# Patient Record
Sex: Male | Born: 1970
Health system: Southern US, Community
[De-identification: ages and names within clinical notes are randomized; demographics above are authoritative.]

## PROBLEM LIST (undated history)

## (undated) DIAGNOSIS — I10 Essential (primary) hypertension: Secondary | ICD-10-CM

---

## 2010-07-16 ENCOUNTER — Other Ambulatory Visit: Payer: Self-pay | Admitting: Family Medicine

## 2010-07-16 ENCOUNTER — Ambulatory Visit
Admission: RE | Admit: 2010-07-16 | Discharge: 2010-07-16 | Disposition: A | Discharge status: Home / Self Care | Payer: BC Managed Care – PPO | Source: Ambulatory Visit | Attending: Family Medicine | Admitting: Family Medicine

## 2010-07-16 ENCOUNTER — Other Ambulatory Visit: Payer: Self-pay

## 2010-07-16 DIAGNOSIS — K3189 Other diseases of stomach and duodenum: Secondary | ICD-10-CM

## 2010-07-16 DIAGNOSIS — R1013 Epigastric pain: Secondary | ICD-10-CM

## 2012-02-17 ENCOUNTER — Emergency Department (HOSPITAL_COMMUNITY)
Admission: EM | Admit: 2012-02-17 | Discharge: 2012-02-17 | Disposition: A | Discharge status: Home / Self Care | Payer: Self-pay | Attending: Emergency Medicine | Admitting: Emergency Medicine

## 2012-02-17 ENCOUNTER — Encounter (HOSPITAL_COMMUNITY): Payer: Self-pay | Admitting: *Deleted

## 2012-02-17 DIAGNOSIS — R05 Cough: Secondary | ICD-10-CM | POA: Insufficient documentation

## 2012-02-17 DIAGNOSIS — I10 Essential (primary) hypertension: Secondary | ICD-10-CM | POA: Insufficient documentation

## 2012-02-17 DIAGNOSIS — R630 Anorexia: Secondary | ICD-10-CM | POA: Insufficient documentation

## 2012-02-17 DIAGNOSIS — J020 Streptococcal pharyngitis: Secondary | ICD-10-CM | POA: Insufficient documentation

## 2012-02-17 DIAGNOSIS — R059 Cough, unspecified: Secondary | ICD-10-CM | POA: Insufficient documentation

## 2012-02-17 DIAGNOSIS — N644 Mastodynia: Secondary | ICD-10-CM | POA: Insufficient documentation

## 2012-02-17 DIAGNOSIS — R509 Fever, unspecified: Secondary | ICD-10-CM | POA: Insufficient documentation

## 2012-02-17 MED ORDER — DEXAMETHASONE SODIUM PHOSPHATE 10 MG/ML IJ SOLN
10.0000 mg | Freq: Once | INTRAMUSCULAR | Status: AC
  Administered 2012-02-17: 10 mg via INTRAMUSCULAR
  Filled 2012-02-17: qty 1

## 2012-02-17 MED ORDER — PENICILLIN G BENZATHINE & PROC 1200000 UNIT/2ML IM SUSP
1.2000 10*6.[IU] | Freq: Once | INTRAMUSCULAR | Status: AC
  Administered 2012-02-17: 1.2 10*6.[IU] via INTRAMUSCULAR
  Filled 2012-02-17: qty 2

## 2012-02-17 MED ORDER — HYDROCODONE-HOMATROPINE 5-1.5 MG/5ML PO SYRP: 5.0000 mL | ORAL_SOLUTION | Freq: Four times a day (QID) | ORAL | Status: DC | PRN

## 2012-02-17 MED ORDER — HYDROCOD POLST-CHLORPHEN POLST 10-8 MG/5ML PO LQCR
5.0000 mL | Freq: Once | ORAL | Status: AC
  Administered 2012-02-17: 5 mL via ORAL
  Filled 2012-02-17: qty 5

## 2012-02-17 NOTE — ED Notes (Addendum)
Hard approx 4 x 4 masses behind each nipple.  No drainage, warmth noted.  No redness noted to pharynx.  Pt stated throat pain began after coughing.  Lung sounds clear.

## 2012-02-17 NOTE — ED Provider Notes (Signed)
History     CSN: [id number]  Arrival date & time 02/17/12  4098   First MD Initiated Contact with Patient 02/17/12 [phone]      Chief Complaint  Patient presents with  . Chest Pain    R/t hard swelling behind both nipples  . Sore Throat    (Consider location/radiation/quality/duration/timing/severity/associated sxs/prior treatment) HPI Comments: 41 year old male with no known significant past medical history presents emergency department with multiple complaints.  Primarily patient reports that he's had a sore throat for one week that has been gradually worsening.  Associated symptoms include fever and cough onset today.  In addition he reports feeling behind full bilaterally that have been gradually growing and painful.  Patient denies any nipple discharge  change in skin color night sweats, chills, weight loss.patient did not have a primary care physician and has not been evaluated in years.    The history is provided by the patient.    History reviewed. No pertinent past medical history.  History reviewed. No pertinent past surgical history.  No family history on file.  History  Substance Use Topics  . Smoking status: Never Smoker   . Smokeless tobacco: Not on file  . Alcohol Use: No      Review of Systems  Constitutional: Positive for fever, chills and appetite change. Negative for diaphoresis, activity change and fatigue.  HENT: Positive for sore throat and trouble swallowing. Negative for congestion, facial swelling, rhinorrhea, sneezing, drooling, mouth sores, neck pain, neck stiffness, dental problem, voice change, postnasal drip and sinus pressure.   Eyes: Negative for visual disturbance.  Respiratory: Positive for cough. Negative for choking, shortness of breath, wheezing and stridor.   Cardiovascular: Negative for chest pain.  Gastrointestinal: Negative for abdominal pain.  Skin: Negative for rash.  Neurological: Negative for dizziness and headaches.   Hematological: Does not bruise/bleed easily.  All other systems reviewed and are negative.    Allergies  Review of patient's allergies indicates no known allergies.  Home Medications  No current outpatient prescriptions on file.  BP 153/105  Pulse 84  Temp 98.7 F (37.1 C) (Oral)  Resp 18  SpO2 99%  Physical Exam  Nursing note and vitals reviewed. Constitutional: He is oriented to person, place, and time. He appears well-developed and well-nourished. No distress.  HENT:  Head: Normocephalic and atraumatic. No trismus in the jaw.  Right Ear: Tympanic membrane, external ear and ear canal normal.  Left Ear: Tympanic membrane, external ear and ear canal normal.  Nose: Nose normal. No rhinorrhea. Right sinus exhibits no maxillary sinus tenderness and no frontal sinus tenderness. Left sinus exhibits no maxillary sinus tenderness and no frontal sinus tenderness.  Mouth/Throat: Uvula is midline and mucous membranes are normal. Normal dentition. No dental abscesses or uvula swelling. Oropharyngeal exudate and posterior oropharyngeal edema present. No posterior oropharyngeal erythema or tonsillar abscesses.       No submental edema, tongue not elevated, no trismus. No impending airway obstruction; Pt able to speak full sentences, swallow intact, no drooling, stridor, or tonsillar/uvula displacement. No palatal petechia  Eyes: Conjunctivae normal are normal.  Neck: Trachea normal, normal range of motion and full passive range of motion without pain. Neck supple. No rigidity. Normal range of motion present. No Brudzinski's sign noted.       Flexion and extension of neck without pain or difficulty. Able to breath without difficulty in extension.  Cardiovascular: Normal rate and regular rhythm.   Pulmonary/Chest: Effort normal and breath sounds normal. No stridor. No  respiratory distress. He has no wheezes. He exhibits tenderness (tenderness to palpation of breast bilaterally, no masses palpable  ).  Abdominal: Soft. There is no tenderness.       No obvious evidence of splenomegaly. Non ttp.   Musculoskeletal: Normal range of motion.  Lymphadenopathy:       Head (right side): No preauricular and no posterior auricular adenopathy present.       Head (left side): No preauricular and no posterior auricular adenopathy present.    He has cervical adenopathy.  Neurological: He is alert and oriented to person, place, and time.  Skin: Skin is warm and dry. No rash noted. He is not diaphoretic.  Psychiatric: He has a normal mood and affect.    ED Course  Procedures (including critical care time)  Labs Reviewed - No data to display No results found.   No diagnosis found.    MDM  Hypertension, sore throat, chest/breast tenderness  Pt hypertensive with tonsillar exudate, cervical lymphadenopathy, & dysphagia; diagnosis of strep. Treated in the Ed with steroids, Pain medication and PCN IM.  Pt appears mildly dehydrated, discussed importance of water rehydration. Presentation non concerning for PTA or infxn spread to soft tissue. No trismus or uvula deviation. Specific return precautions discussed. Pt able to drink water in ED without difficulty with intact air way. IN addition will refer to breast center for imaging. PCP guide given to evaluate HTN Recommended PCP follow up.          Jaci Carrel, [place] 02/17/12 [phone]

## 2012-02-17 NOTE — ED Provider Notes (Signed)
Medical screening examination/treatment/procedure(s) were performed by non-physician practitioner and as supervising physician I was immediately available for consultation/collaboration.  Jones Skene, M.D.     Jones Skene, MD 02/17/12 1739

## 2012-02-17 NOTE — ED Notes (Signed)
Pt states he felt small lumps behind both nipples. Since then they have grown to at least 4 inches in diameter and are increasingly painful.  Denies discharge.  Pt also c/o sore throat and cough x 1 week.  Came in tonight because the swelling behind his nipples is becoming very painful.

## 2012-02-17 NOTE — Discharge Instructions (Signed)
Salt Water Gargle This solution will help make your mouth and throat feel better. HOME CARE INSTRUCTIONS   Mix 1 teaspoon of salt in 8 ounces of warm water.  Gargle with this solution as much or often as you need or as directed. Swish and gargle gently if you have any sores or wounds in your mouth.  Do not swallow this mixture. Document Released: 12/03/2003 Document Revised: 05/23/2011 Document Reviewed: 04/25/2008 [facility] Patient Information 2013 [place], [place].  SEEK MEDICAL CARE IF:   Any part of your breast is hard, red and hot to the touch. This could be a sign of infection.  Fluid is coming out of your nipples (and you are not breastfeeding). Especially watch for blood or pus.  You have a fever as well as breast tenderness.  You have a new or painful lump in your breast that remains after your period ends.  You have tried to take care of the pain at home, but it has not gone away.  Your breast pain is getting worse. Or, the pain is making it hard to do the things you usually do during your day. Document Released: 02/11/2008 Document Revised: 05/23/2011 Document Reviewed: 02/11/2008 [facility] Patient Information 2013 [place], [place].

## 2012-11-10 ENCOUNTER — Emergency Department (HOSPITAL_COMMUNITY): Payer: BC Managed Care – PPO

## 2012-11-10 ENCOUNTER — Encounter (HOSPITAL_COMMUNITY): Payer: Self-pay | Admitting: Nurse Practitioner

## 2012-11-10 ENCOUNTER — Emergency Department (HOSPITAL_COMMUNITY)
Admission: EM | Admit: 2012-11-10 | Discharge: 2012-11-10 | Disposition: A | Discharge status: Home / Self Care | Payer: BC Managed Care – PPO | Attending: Emergency Medicine | Admitting: Emergency Medicine

## 2012-11-10 DIAGNOSIS — R209 Unspecified disturbances of skin sensation: Secondary | ICD-10-CM | POA: Insufficient documentation

## 2012-11-10 DIAGNOSIS — M545 Low back pain, unspecified: Secondary | ICD-10-CM | POA: Insufficient documentation

## 2012-11-10 DIAGNOSIS — M549 Dorsalgia, unspecified: Secondary | ICD-10-CM

## 2012-11-10 DIAGNOSIS — R202 Paresthesia of skin: Secondary | ICD-10-CM

## 2012-11-10 LAB — URINALYSIS, ROUTINE W REFLEX MICROSCOPIC
Bilirubin Urine: NEGATIVE
Glucose, UA: NEGATIVE mg/dL
Ketones, ur: NEGATIVE mg/dL
Leukocytes, UA: NEGATIVE
Specific Gravity, Urine: 1.014 (ref 1.005–1.030)
pH: 5.5 (ref 5.0–8.0)

## 2012-11-10 LAB — GLUCOSE, CAPILLARY: Glucose-Capillary: 98 mg/dL (ref 70–99)

## 2012-11-10 MED ORDER — IBUPROFEN 800 MG PO TABS: 800.0000 mg | ORAL_TABLET | Freq: Three times a day (TID) | ORAL | Status: DC

## 2012-11-10 MED ORDER — HYDROCODONE-ACETAMINOPHEN 5-325 MG PO TABS: 2.0000 | ORAL_TABLET | ORAL | Status: DC | PRN

## 2012-11-10 NOTE — ED Notes (Addendum)
Pt reports intermittent loss of sensation in bilateral legs from thighs down to feet over past weeks, then he started to have L sided lower back pain. Over past few days has felt "Clammy and sweaty." ambulatory now, A&Ox4. No bowel/bladder changes

## 2012-11-10 NOTE — ED Notes (Signed)
Pt ambulated well.  Did not complain of pain or numbness at this time.

## 2012-11-10 NOTE — ED Notes (Addendum)
Pt reports bilateral intermittent numbness to lower extremities that lasts for 5 seconds at a time x2 weeks. Pt also states he had left sided posterior pain above flank area and goes down leg x1 week. Pt states pain in his back is unrelated to leg numbness.

## 2012-11-10 NOTE — ED Provider Notes (Signed)
CSN: [id number]     Arrival date & time 11/10/12  1018 History   First MD Initiated Contact with Patient 11/10/12 1114     Chief Complaint  Patient presents with  . Back Pain   (Consider location/radiation/quality/duration/timing/severity/associated sxs/prior Treatment) HPI Comments: Patient claims intermittent "loss of sensation" in his bilateral legs from his thighs to the feet for the past few weeks. He states this numbness lasted for 5 seconds at a time and resolves but recurs frequently. He denies any focal weakness. No bowel or bladder incontinence. No difficulty walking. No abdominal pain, nausea vomiting or fever. She denies any history of back problems. He also endorses left sided flank pain it radiates down his left leg for the past one week as well. Denies any back injury. Denies any urinary symptoms. Denies any testicular pain. Denies any history of cancer or IV drug abuse. He has no numbness currently.  The history is provided by the patient.    History reviewed. No pertinent past medical history. History reviewed. No pertinent past surgical history. History reviewed. No pertinent family history. History  Substance Use Topics  . Smoking status: Never Smoker   . Smokeless tobacco: Not on file  . Alcohol Use: No    Review of Systems  Constitutional: Negative for activity change and appetite change.  HENT: Negative for congestion and rhinorrhea.   Respiratory: Negative for cough, chest tightness and shortness of breath.   Cardiovascular: Negative for chest pain.  Gastrointestinal: Negative for nausea, vomiting and abdominal pain.  Genitourinary: Negative for dysuria, hematuria and testicular pain.  Musculoskeletal: Positive for back pain. Negative for gait problem.  Skin: Negative for rash.  Neurological: Positive for numbness. Negative for dizziness, weakness and headaches.  A complete 10 system review of systems was obtained and all systems are negative except as noted in  the HPI and PMH.    Allergies  Review of patient's allergies indicates no known allergies.  Home Medications   Current Outpatient Rx  Name  Route  Sig  Dispense  Refill  . HYDROcodone-acetaminophen (NORCO/VICODIN) 5-325 MG per tablet   Oral   Take 2 tablets by mouth every 4 (four) hours as needed for pain.   10 tablet   0   . ibuprofen (ADVIL,MOTRIN) 800 MG tablet   Oral   Take 1 tablet (800 mg total) by mouth 3 (three) times daily.   21 tablet   0    BP 151/99  Pulse 60  Temp(Src) 97.9 F (36.6 C) (Oral)  Resp 18  SpO2 94% Physical Exam  Constitutional: He is oriented to person, place, and time. He appears well-developed and well-nourished. No distress.  HENT:  Head: Normocephalic.  Mouth/Throat: Oropharynx is clear and moist. No oropharyngeal exudate.  Eyes: Conjunctivae and EOM are normal. Pupils are equal, round, and reactive to light.  Neck: Normal range of motion. Neck supple.  Cardiovascular: Normal rate, regular rhythm and normal heart sounds.   No murmur heard. Equal femoral, DP, radial pulses bilaterally.  Pulmonary/Chest: Effort normal and breath sounds normal. No respiratory distress.  Abdominal: There is no tenderness. There is no rebound and no guarding.  Musculoskeletal: Normal range of motion. He exhibits tenderness. He exhibits no edema.  No midline T or L spine tenderness. L CVAT  5/5 strength in bilateral lower extremities. Ankle plantar and dorsiflexion intact. Great toe extension intact bilaterally. +2 DP and PT pulses. +2 patellar reflexes bilaterally. Normal gait.   Neurological: He is alert and oriented to person,  place, and time. No cranial nerve deficit. He exhibits normal muscle tone. Coordination normal.  Skin: Skin is warm.    ED Course  Procedures (including critical care time) Labs Review Labs Reviewed  URINALYSIS, ROUTINE W REFLEX MICROSCOPIC  GLUCOSE, CAPILLARY   Imaging Review Dg Lumbar Spine Complete  11/10/2012    *RADIOLOGY REPORT*  Clinical Data: Low back pain, no injury  LUMBAR SPINE - COMPLETE 4+ VIEW  Comparison: None  Findings: Normal alignment.  No fracture or mass.  Mild disc degeneration with small endplate defects anteriorly at the L2-3 and L3-4.  Negative for pars defect.  No mass lesion.  IMPRESSION: Mild disc degeneration.  No acute abnormality.   Original Report Authenticated By: Janeece Riggers, M.D.    MDM   1. Paresthesias   2. Back pain    Intermittent tingling sensation to bilateral legs associated with left-sided low back pain. Normal neurological exam at this time. No evidence of cauda equina or cord compression.  Patient has no motor or sensory neurological deficits on exam. His urinalysis is normal. His CBG is normal.  He is currently having no paresthesias or weakness. Uncertain cause of patient's intermittent paresthesias in his lower extremities. No evidence for cord compression, cauda equina or other neurosurgical emergency. He is able to ambulate without pain, weakness or numbness. He'll be referred to neurology. Return precautions discussed.  Glynn Octave, MD 11/10/12 [phone]

## 2013-08-03 ENCOUNTER — Encounter (HOSPITAL_COMMUNITY): Payer: Self-pay | Admitting: Emergency Medicine

## 2013-08-03 ENCOUNTER — Emergency Department (HOSPITAL_COMMUNITY): Payer: BC Managed Care – PPO

## 2013-08-03 ENCOUNTER — Emergency Department (HOSPITAL_COMMUNITY)
Admission: EM | Admit: 2013-08-03 | Discharge: 2013-08-03 | Disposition: A | Discharge status: Home / Self Care | Payer: BC Managed Care – PPO | Attending: Emergency Medicine | Admitting: Emergency Medicine

## 2013-08-03 DIAGNOSIS — N451 Epididymitis: Secondary | ICD-10-CM

## 2013-08-03 DIAGNOSIS — N453 Epididymo-orchitis: Secondary | ICD-10-CM | POA: Insufficient documentation

## 2013-08-03 LAB — URINALYSIS, ROUTINE W REFLEX MICROSCOPIC
BILIRUBIN URINE: NEGATIVE
GLUCOSE, UA: NEGATIVE mg/dL
KETONES UR: NEGATIVE mg/dL
Nitrite: NEGATIVE
PROTEIN: 30 mg/dL — AB
Specific Gravity, Urine: 1.018 (ref 1.005–1.030)
Urobilinogen, UA: 1 mg/dL (ref 0.0–1.0)
pH: 5.5 (ref 5.0–8.0)

## 2013-08-03 LAB — URINE MICROSCOPIC-ADD ON

## 2013-08-03 MED ORDER — CIPROFLOXACIN HCL 500 MG PO TABS: 500.0000 mg | ORAL_TABLET | Freq: Two times a day (BID) | ORAL | Status: AC

## 2013-08-03 MED ORDER — HYDROCODONE-ACETAMINOPHEN 5-325 MG PO TABS: 1.0000 | ORAL_TABLET | ORAL | Status: AC | PRN

## 2013-08-03 MED ORDER — MORPHINE SULFATE 4 MG/ML IJ SOLN
4.0000 mg | Freq: Once | INTRAMUSCULAR | Status: AC
  Administered 2013-08-03: 4 mg via INTRAVENOUS
  Filled 2013-08-03: qty 1

## 2013-08-03 NOTE — ED Notes (Signed)
Pt states right testicular pain, onset Monday.  Painful to touch, relieved by Motrin. No urinary symptoms noted.

## 2013-08-03 NOTE — Discharge Instructions (Signed)
Take the prescribed medication as directed. Follow-up with urology if problems occur. Return to the ED for new or worsening symptoms.

## 2013-08-03 NOTE — ED Provider Notes (Signed)
CSN: [id number]     Arrival date & time 08/03/13  1315 History   First MD Initiated Contact with Patient 08/03/13 1348     Chief Complaint  Patient presents with  . Testicle Pain     (Consider location/radiation/quality/duration/timing/severity/associated sxs/prior Treatment) HPI Comments: Patient is a 43 yo M presenting to the ED for severe sore R testicular pain and swelling that began Monday while at work, but denies any trauma or injury. He states Motrin had been alleviating his pain until the last day or so. His pain is worsened with palpation, and palpation causes radiation to right groin. Denies any fevers, chills, nausea, vomiting, abdominal pain, urinary symptoms, rectal pain, penile discharge or pain, constipation, or diarrhea. No abdominal surgical history.   Patient is a 43 y.o. male presenting with testicular pain.  Testicle Pain Pertinent negatives include no abdominal pain, chills, fever, nausea or vomiting.    History reviewed. No pertinent past medical history. History reviewed. No pertinent past surgical history. No family history on file. History  Substance Use Topics  . Smoking status: Never Smoker   . Smokeless tobacco: Not on file  . Alcohol Use: No    Review of Systems  Constitutional: Negative for fever and chills.  Gastrointestinal: Negative for nausea, vomiting, abdominal pain and diarrhea.  Genitourinary: Positive for scrotal swelling and testicular pain. Negative for dysuria, urgency, frequency, hematuria, flank pain, decreased urine volume, discharge, penile swelling, enuresis, difficulty urinating, genital sores and penile pain.  Musculoskeletal: Negative for back pain.  All other systems reviewed and are negative.     Allergies  Review of patient's allergies indicates no known allergies.  Home Medications   Prior to Admission medications   Medication Sig Start Date End Date Taking? Authorizing Provider  ibuprofen (ADVIL,MOTRIN) 200 MG tablet  Take 200 mg by mouth every 6 (six) hours as needed for moderate pain.   Yes Historical Provider, MD   BP 151/100  Pulse 73  Temp(Src) 97.9 F (36.6 C) (Oral)  Resp 18  SpO2 100% Physical Exam  Nursing note and vitals reviewed. Constitutional: He is oriented to person, place, and time. He appears well-developed and well-nourished. No distress.  HENT:  Head: Normocephalic and atraumatic.  Right Ear: External ear normal.  Left Ear: External ear normal.  Nose: Nose normal.  Mouth/Throat: Oropharynx is clear and moist.  Eyes: Conjunctivae are normal.  Neck: Normal range of motion. Neck supple.  Cardiovascular: Normal rate, regular rhythm and normal heart sounds.   Pulmonary/Chest: Effort normal and breath sounds normal. No respiratory distress.  Abdominal: Soft. Bowel sounds are normal. There is no tenderness.  Genitourinary: Rectum normal, prostate normal and penis normal. Right testis shows swelling and tenderness. Right testis shows no mass. Right testis is descended. Left testis shows no mass, no swelling and no tenderness. Left testis is descended. Uncircumcised. No phimosis, paraphimosis, hypospadias, penile erythema or penile tenderness. No discharge found.  Right testicle is not warmth or erythematous.   Musculoskeletal: Normal range of motion.  Lymphadenopathy:       Right: No inguinal adenopathy present.       Left: No inguinal adenopathy present.  Neurological: He is alert and oriented to person, place, and time.  Skin: Skin is warm and dry. He is not diaphoretic.  Psychiatric: He has a normal mood and affect.    ED Course  Procedures (including critical care time) Medications  morphine 4 MG/ML injection 4 mg (4 mg Intravenous Given 08/03/13 1445)    Labs  Review Labs Reviewed  URINE CULTURE  GC/CHLAMYDIA PROBE AMP  URINALYSIS, ROUTINE W REFLEX MICROSCOPIC    Imaging Review No results found.   EKG Interpretation None      MDM   Final diagnoses:  None     Filed Vitals:   08/03/13 1321  BP: 151/100  Pulse: 73  Temp: 97.9 F (36.6 C)  Resp: 18     Afebrile, NAD, non-toxic appearing, AAOx4. Left testicle is swollen and TTP, no erythema or warmth. No penile tenderness or discharge. No paraphimosis or phimosis. R testicle is non TTP. Prostate examination is benign. Plan to order ultrasound. UA sent, Urine GC/Chlamydia sent as well. Patient signed out to Sharilyn SitesLisa Sanders, PA-C pending UA and imaging.   Jeannetta EllisJennifer L Carrel Leather, PA-C 08/03/13 1617

## 2013-08-03 NOTE — ED Notes (Signed)
US Bedside.

## 2013-08-03 NOTE — ED Provider Notes (Signed)
Medical screening examination/treatment/procedure(s) were performed by non-physician practitioner and as supervising physician I was immediately available for consultation/collaboration.   EKG Interpretation None        Ellianna Ruest L Neeti Knudtson, MD 08/03/13 2211 

## 2013-08-03 NOTE — ED Provider Notes (Signed)
Pt received in sign out from PA Piepenbrink at shift change.  43 y.o. M with right testicle pain beginning Monday morning (5 days ago).  No abdominal pain, urinary sx, urethral discharge, fever, chills. Pt is married, no concern for STD.  No prior hx of STD.  Plan:  Urine and gc/chl pending.  U/s also pending to r/o torsion.  If ultrasound negative for torsion, start ciprofloxacin.  Results for orders placed during the hospital encounter of 08/03/13  URINALYSIS, ROUTINE W REFLEX MICROSCOPIC      Result Value Ref Range   Color, Urine YELLOW  YELLOW   APPearance CLOUDY (*) CLEAR   Specific Gravity, Urine 1.018  1.005 - 1.030   pH 5.5  5.0 - 8.0   Glucose, UA NEGATIVE  NEGATIVE mg/dL   Hgb urine dipstick MODERATE (*) NEGATIVE   Bilirubin Urine NEGATIVE  NEGATIVE   Ketones, ur NEGATIVE  NEGATIVE mg/dL   Protein, ur 30 (*) NEGATIVE mg/dL   Urobilinogen, UA 1.0  0.0 - 1.0 mg/dL   Nitrite NEGATIVE  NEGATIVE   Leukocytes, UA LARGE (*) NEGATIVE  URINE MICROSCOPIC-ADD ON      Result Value Ref Range   WBC, UA 21-50  <3 WBC/hpf   RBC / HPF 3-6  <3 RBC/hpf   [place] Scrotum  08/03/2013   CLINICAL DATA:  Testicle pain  EXAM: ULTRASOUND OF SCROTUM  TECHNIQUE: Complete ultrasound examination of the testicles, epididymis, and other scrotal structures was performed.  COMPARISON:  None.  FINDINGS: Right testicle  Measurements: 4.6 x 2.3 x 3.0 cm. No mass or microlithiasis visualized. The right testicular parenchyma is slightly hypervascular compared to the left.  Left testicle  Measurements: 4.6 x 2.5 x 2.9 cm. No mass or microlithiasis visualized.  Right epididymis:  Mildly heterogeneous and hypervascular.  Left epididymis:  6 mm spermatocele versus epididymal cyst.  Hydrocele: Trace right hydrocele is sonographically simple in appearance.  Varicocele: Prominent template of formed venous plexus. Echogenic material within the inguinal canal may represent spermatic cord lipoma or herniated omental fat.  IMPRESSION:  1. Sonographic findings are suggestive of right-sided epididymal orchitis. 2. Trace right hydrocele is likely reactive. 3. Echogenic material on the right inguinal canal may represent spermatic cord lipoma or some a herniated omental fat. 4. 6 mm left epididymal cyst incidentally noted.   Electronically Signed   By: Malachy MoanHeath  McCullough M.D.   On: 08/03/2013 15:38   [place] Art/ven Flow Abd Pelv Doppler  08/03/2013   CLINICAL DATA:  Testicle pain  EXAM: ULTRASOUND OF SCROTUM  TECHNIQUE: Complete ultrasound examination of the testicles, epididymis, and other scrotal structures was performed.  COMPARISON:  None.  FINDINGS: Right testicle  Measurements: 4.6 x 2.3 x 3.0 cm. No mass or microlithiasis visualized. The right testicular parenchyma is slightly hypervascular compared to the left.  Left testicle  Measurements: 4.6 x 2.5 x 2.9 cm. No mass or microlithiasis visualized.  Right epididymis:  Mildly heterogeneous and hypervascular.  Left epididymis:  6 mm spermatocele versus epididymal cyst.  Hydrocele: Trace right hydrocele is sonographically simple in appearance.  Varicocele: Prominent template of formed venous plexus. Echogenic material within the inguinal canal may represent spermatic cord lipoma or herniated omental fat.  IMPRESSION: 1. Sonographic findings are suggestive of right-sided epididymal orchitis. 2. Trace right hydrocele is likely reactive. 3. Echogenic material on the right inguinal canal may represent spermatic cord lipoma or some a herniated omental fat. 4. 6 mm left epididymal cyst incidentally noted.   Electronically Signed  By: Malachy Moan M.D.   On: 08/03/2013 15:38    4:16 PM Ultrasound without evidence of torsion, suggestive of right-sided epididymal orchitis.  U/a with large leuks, will send for culture.  Gc/chl pending.  On discussion with pt he again confirms that he has no prior hx of STD and has no current concern for STD, only partner is his wife.  Will start on ciprofloxacin.  He  will FU with urology.  vicodin for pain. Discussed plan with patient, he/she acknowledged understanding and agreed with plan of care.  Return precautions given for new or worsening symptoms.  Garlon Hatchet, PA-C 08/03/13 [phone]

## 2013-08-03 NOTE — ED Notes (Signed)
Pt presents with c/o right testicular pain that started Monday of this week and is now traveling through his right groin area. Pt thought it was a pulled muscle at first. Pt says the testicle is very painful to touch, unsure of whether there is swelling or not at this time. Pt has no urinary symptoms or problems at this time.

## 2013-08-03 NOTE — ED Notes (Signed)
Provided pt with Urinal. Explained we needed sample and to notify when voided

## 2013-08-05 LAB — URINE CULTURE
Colony Count: NO GROWTH
Culture: NO GROWTH

## 2013-08-05 LAB — GC/CHLAMYDIA PROBE AMP
CT PROBE, AMP APTIMA: NEGATIVE
GC PROBE AMP APTIMA: NEGATIVE

## 2013-08-05 NOTE — ED Provider Notes (Signed)
Medical screening examination/treatment/procedure(s) were performed by non-physician practitioner and as supervising physician I was immediately available for consultation/collaboration.   EKG Interpretation None       Flint Melter, MD 08/05/13 1019

## 2014-11-19 IMAGING — US US SCROTUM
1 series · 14 of 25 positions shown · non-contrast
Comparison: None.

CLINICAL DATA: Testicle pain

EXAM:
ULTRASOUND OF SCROTUM
TECHNIQUE: Complete ultrasound examination of the testicles, epididymis, and
other scrotal structures was performed.

[Series 1: us scrotum · 0.06mm/px · 45 acquisitions, 14 frames shown]
[im 1/45]
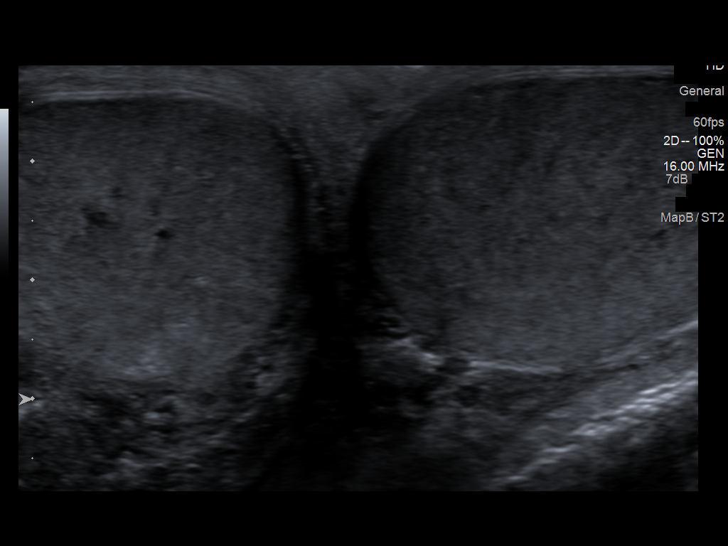
[im 4/45]
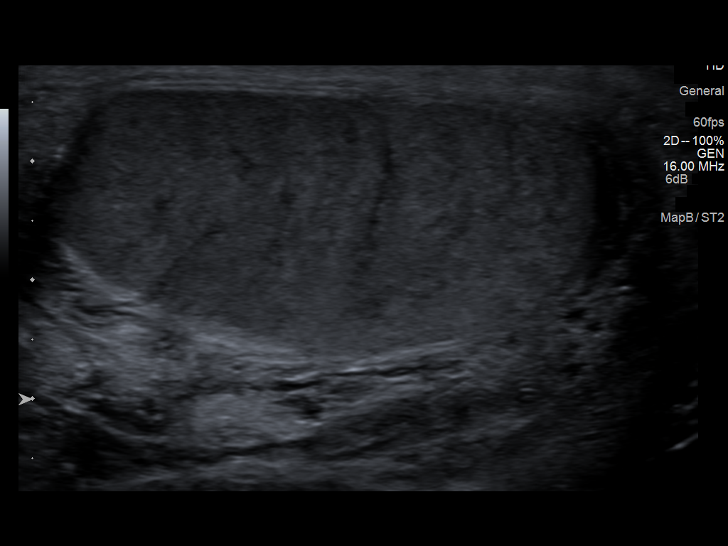
[im 8/45]
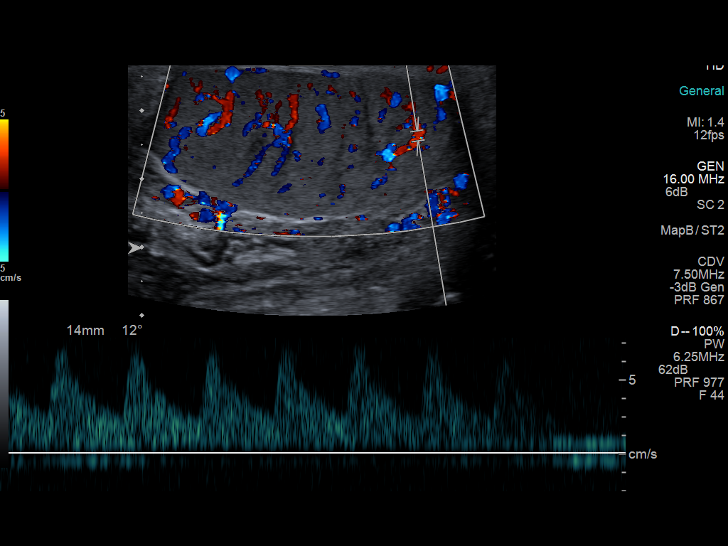
[im 12/45]
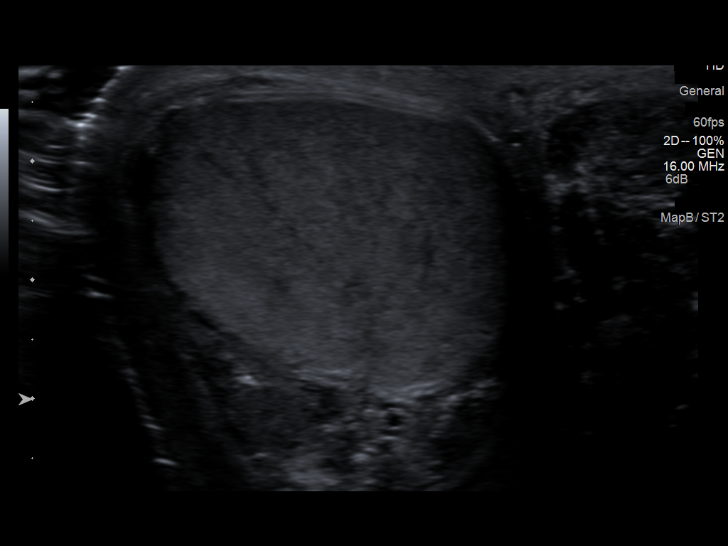
[im 15/45]
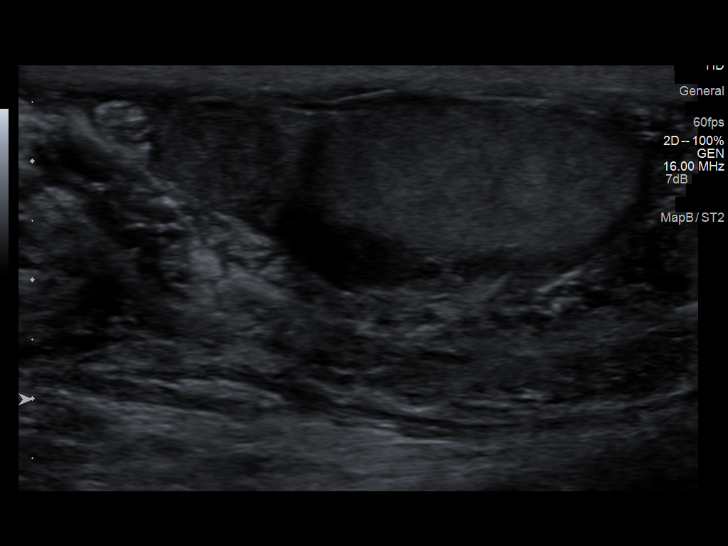
[im 17/45]
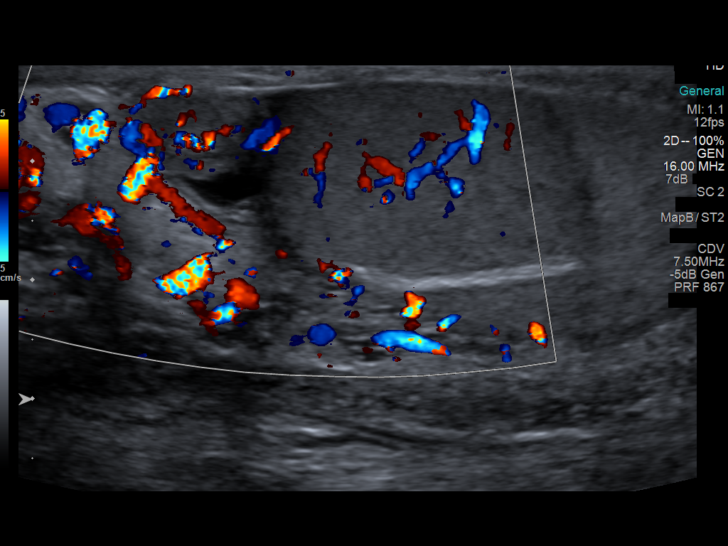
[im 21/45]
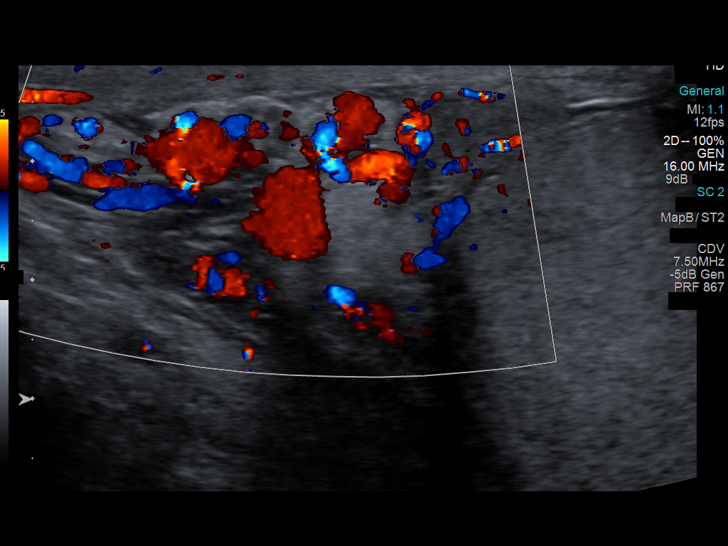
[im 24/45]
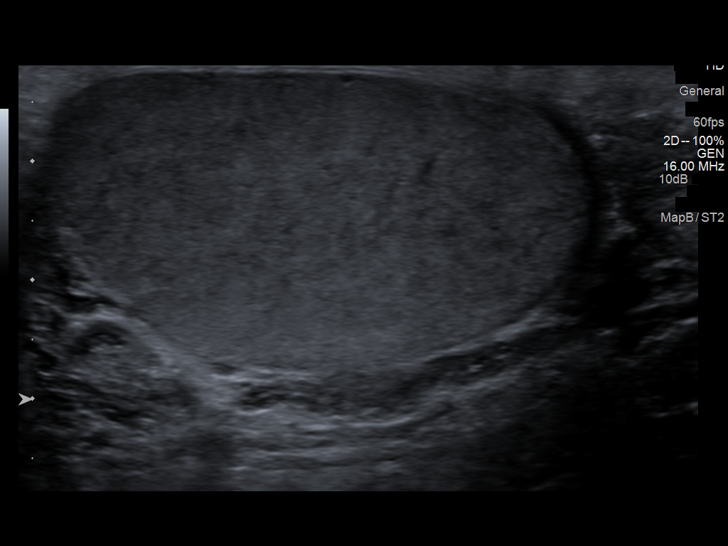
[im 28/45]
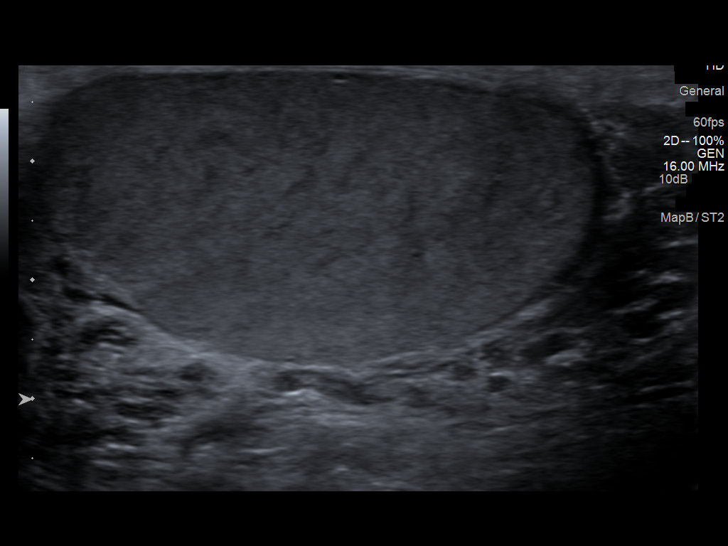
[im 30/45]
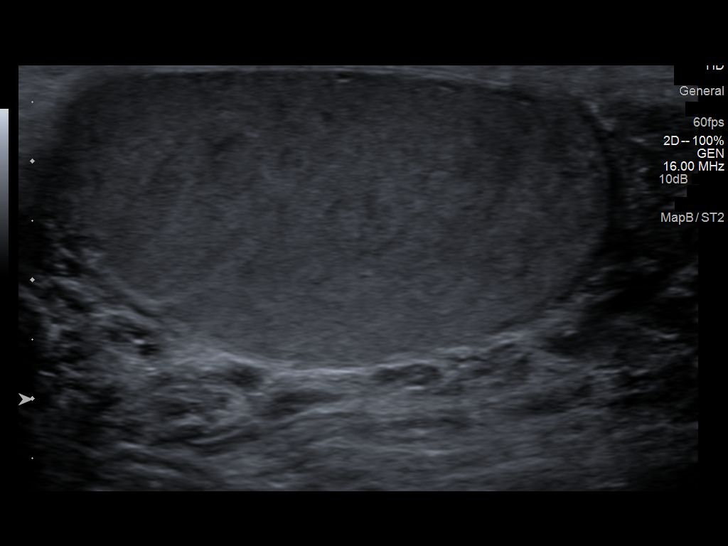
[im 34/45]
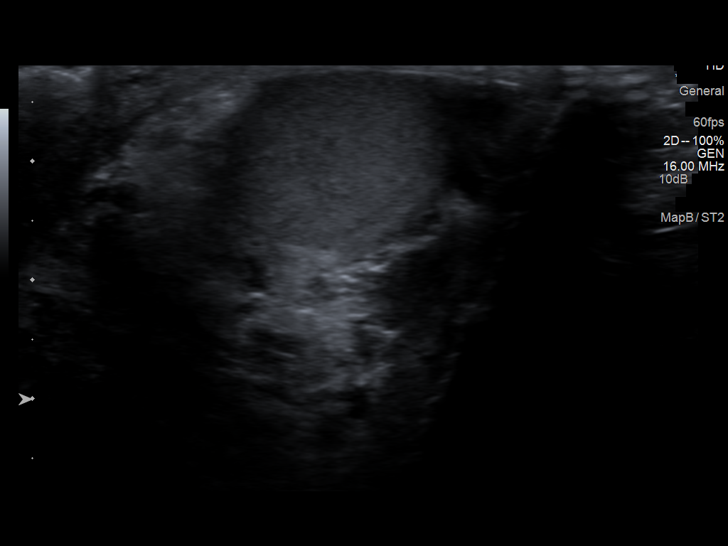
[im 37/45]
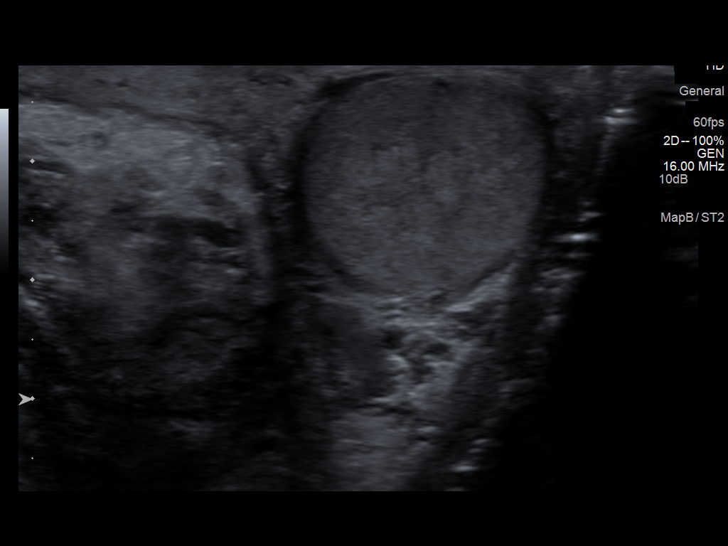
[im 41/45]
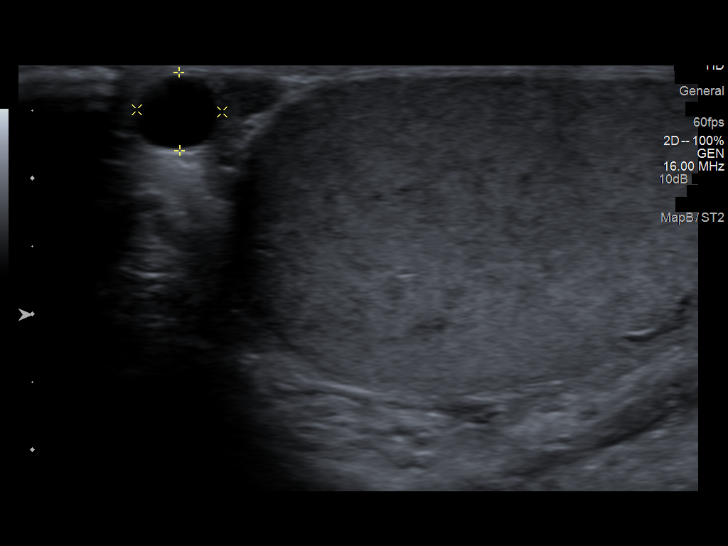
[im 45/45]
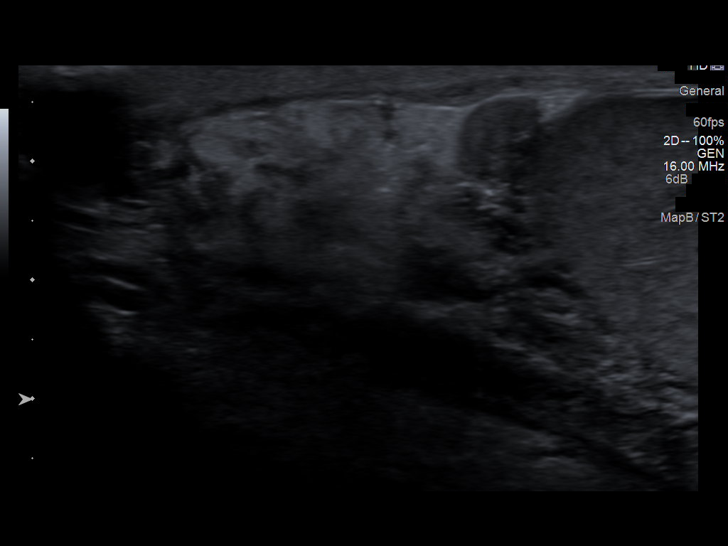

[14 of 25 positions shown; findings below may reference images not displayed]

FINDINGS: Right testicle

Measurements: 4.6 x 2.3 x 3.0 cm. No mass or microlithiasis
visualized. The right testicular parenchyma is slightly
hypervascular compared to the left.

Left testicle

Measurements: 4.6 x 2.5 x 2.9 cm. No mass or microlithiasis
visualized.

Right epididymis:  Mildly heterogeneous and hypervascular.

Left epididymis:  6 mm spermatocele versus epididymal cyst.

Hydrocele: Trace right hydrocele is sonographically simple in
appearance.

Varicocele: Prominent template of formed venous plexus. Echogenic
material within the inguinal canal may represent spermatic cord
lipoma or herniated omental fat.
IMPRESSION: 1. Sonographic findings are suggestive of right-sided epididymal
orchitis.
2. Trace right hydrocele is likely reactive.
3. Echogenic material on the right inguinal canal may represent
spermatic cord lipoma or some a herniated omental fat.
4. 6 mm left epididymal cyst incidentally noted.

## 2015-07-09 DIAGNOSIS — R7303 Prediabetes: Secondary | ICD-10-CM | POA: Diagnosis not present

## 2015-07-09 DIAGNOSIS — R252 Cramp and spasm: Secondary | ICD-10-CM | POA: Diagnosis not present

## 2015-07-09 DIAGNOSIS — I10 Essential (primary) hypertension: Secondary | ICD-10-CM | POA: Diagnosis not present

## 2015-07-09 DIAGNOSIS — E785 Hyperlipidemia, unspecified: Secondary | ICD-10-CM | POA: Diagnosis not present

## 2015-07-17 DIAGNOSIS — E876 Hypokalemia: Secondary | ICD-10-CM | POA: Diagnosis not present

## 2015-10-08 DIAGNOSIS — R768 Other specified abnormal immunological findings in serum: Secondary | ICD-10-CM | POA: Diagnosis not present

## 2015-10-08 DIAGNOSIS — R7309 Other abnormal glucose: Secondary | ICD-10-CM | POA: Diagnosis not present

## 2015-10-08 DIAGNOSIS — I10 Essential (primary) hypertension: Secondary | ICD-10-CM | POA: Diagnosis not present

## 2015-10-08 DIAGNOSIS — Z202 Contact with and (suspected) exposure to infections with a predominantly sexual mode of transmission: Secondary | ICD-10-CM | POA: Diagnosis not present

## 2015-10-08 DIAGNOSIS — E785 Hyperlipidemia, unspecified: Secondary | ICD-10-CM | POA: Diagnosis not present

## 2015-10-09 DIAGNOSIS — Z202 Contact with and (suspected) exposure to infections with a predominantly sexual mode of transmission: Secondary | ICD-10-CM | POA: Diagnosis not present

## 2015-10-15 DIAGNOSIS — A749 Chlamydial infection, unspecified: Secondary | ICD-10-CM | POA: Diagnosis not present

## 2015-10-27 DIAGNOSIS — R7301 Impaired fasting glucose: Secondary | ICD-10-CM | POA: Diagnosis not present

## 2015-10-27 DIAGNOSIS — Z23 Encounter for immunization: Secondary | ICD-10-CM | POA: Diagnosis not present

## 2015-10-27 DIAGNOSIS — Z125 Encounter for screening for malignant neoplasm of prostate: Secondary | ICD-10-CM | POA: Diagnosis not present

## 2015-10-27 DIAGNOSIS — Z0000 Encounter for general adult medical examination without abnormal findings: Secondary | ICD-10-CM | POA: Diagnosis not present

## 2015-10-27 DIAGNOSIS — E785 Hyperlipidemia, unspecified: Secondary | ICD-10-CM | POA: Diagnosis not present

## 2016-04-28 DIAGNOSIS — R5383 Other fatigue: Secondary | ICD-10-CM | POA: Diagnosis not present

## 2016-04-28 DIAGNOSIS — R350 Frequency of micturition: Secondary | ICD-10-CM | POA: Diagnosis not present

## 2016-04-28 DIAGNOSIS — R7303 Prediabetes: Secondary | ICD-10-CM | POA: Diagnosis not present

## 2016-04-28 DIAGNOSIS — I10 Essential (primary) hypertension: Secondary | ICD-10-CM | POA: Diagnosis not present

## 2016-04-28 DIAGNOSIS — E7800 Pure hypercholesterolemia, unspecified: Secondary | ICD-10-CM | POA: Diagnosis not present

## 2016-06-24 DIAGNOSIS — I10 Essential (primary) hypertension: Secondary | ICD-10-CM | POA: Diagnosis not present

## 2016-06-24 DIAGNOSIS — H9192 Unspecified hearing loss, left ear: Secondary | ICD-10-CM | POA: Diagnosis not present

## 2016-06-24 DIAGNOSIS — Z23 Encounter for immunization: Secondary | ICD-10-CM | POA: Diagnosis not present

## 2016-06-24 DIAGNOSIS — H5201 Hypermetropia, right eye: Secondary | ICD-10-CM | POA: Diagnosis not present

## 2016-06-24 DIAGNOSIS — E785 Hyperlipidemia, unspecified: Secondary | ICD-10-CM | POA: Diagnosis not present

## 2016-06-28 DIAGNOSIS — H9192 Unspecified hearing loss, left ear: Secondary | ICD-10-CM | POA: Diagnosis not present

## 2016-06-28 DIAGNOSIS — E785 Hyperlipidemia, unspecified: Secondary | ICD-10-CM | POA: Diagnosis not present

## 2016-06-28 DIAGNOSIS — H5201 Hypermetropia, right eye: Secondary | ICD-10-CM | POA: Diagnosis not present

## 2016-06-28 DIAGNOSIS — Z114 Encounter for screening for human immunodeficiency virus [HIV]: Secondary | ICD-10-CM | POA: Diagnosis not present

## 2016-06-28 DIAGNOSIS — I10 Essential (primary) hypertension: Secondary | ICD-10-CM | POA: Diagnosis not present

## 2016-06-28 DIAGNOSIS — Z1159 Encounter for screening for other viral diseases: Secondary | ICD-10-CM | POA: Diagnosis not present

## 2016-07-09 ENCOUNTER — Emergency Department (HOSPITAL_COMMUNITY): Payer: BLUE CROSS/BLUE SHIELD

## 2016-07-09 ENCOUNTER — Emergency Department (HOSPITAL_COMMUNITY)
Admission: EM | Admit: 2016-07-09 | Discharge: 2016-07-09 | Disposition: A | Discharge status: Home / Self Care | Payer: BLUE CROSS/BLUE SHIELD | Attending: Emergency Medicine | Admitting: Emergency Medicine

## 2016-07-09 ENCOUNTER — Encounter (HOSPITAL_COMMUNITY): Payer: Self-pay

## 2016-07-09 DIAGNOSIS — Z79899 Other long term (current) drug therapy: Secondary | ICD-10-CM | POA: Diagnosis not present

## 2016-07-09 DIAGNOSIS — R0789 Other chest pain: Secondary | ICD-10-CM | POA: Insufficient documentation

## 2016-07-09 DIAGNOSIS — I10 Essential (primary) hypertension: Secondary | ICD-10-CM | POA: Insufficient documentation

## 2016-07-09 DIAGNOSIS — R079 Chest pain, unspecified: Secondary | ICD-10-CM | POA: Diagnosis not present

## 2016-07-09 HISTORY — DX: Essential (primary) hypertension: I10

## 2016-07-09 LAB — BASIC METABOLIC PANEL
ANION GAP: 9 (ref 5–15)
BUN: 16 mg/dL (ref 6–20)
CALCIUM: 9.6 mg/dL (ref 8.9–10.3)
CO2: 31 mmol/L (ref 22–32)
CREATININE: 1.25 mg/dL — AB (ref 0.61–1.24)
Chloride: 98 mmol/L — ABNORMAL LOW (ref 101–111)
Glucose, Bld: 96 mg/dL (ref 65–99)
Potassium: 3.2 mmol/L — ABNORMAL LOW (ref 3.5–5.1)
Sodium: 138 mmol/L (ref 135–145)

## 2016-07-09 LAB — CBC
HCT: 42 % (ref 39.0–52.0)
HEMOGLOBIN: 13.9 g/dL (ref 13.0–17.0)
MCH: 28.7 pg (ref 26.0–34.0)
MCHC: 33.1 g/dL (ref 30.0–36.0)
MCV: 86.6 fL (ref 78.0–100.0)
Platelets: 257 10*3/uL (ref 150–400)
RBC: 4.85 MIL/uL (ref 4.22–5.81)
RDW: 12.5 % (ref 11.5–15.5)
WBC: 6 10*3/uL (ref 4.0–10.5)

## 2016-07-09 LAB — TROPONIN I: Troponin I: <0.03 ng/mL (ref <0.03)

## 2016-07-09 LAB — D-DIMER, QUANTITATIVE: D-Dimer, Quant: 0.29 ug/mL-FEU (ref 0.00–0.50)

## 2016-07-09 MED ORDER — IBUPROFEN 400 MG PO TABS
400.0000 mg | ORAL_TABLET | Freq: Once | ORAL | Status: AC
  Administered 2016-07-09: 400 mg via ORAL
  Filled 2016-07-09: qty 1

## 2016-07-09 NOTE — ED Provider Notes (Signed)
[facility]-EMERGENCY DEPT Provider Note   CSN: [id number] Arrival date & time: 07/09/16  2121     History   Chief Complaint Chief Complaint  Patient presents with  . Chest Pain    HPI Erik Mclaughlin is a 46 y.o. male.  The history is provided by the patient.  Chest Pain   This is a new problem. The current episode started 1 to 2 hours ago. The problem occurs constantly. The problem has been gradually improving. Associated with: Started spontaneously when he was sitting doing nothing. The pain is present in the lateral region. The pain is at a severity of 5/10. The pain is moderate. The quality of the pain is described as sharp (started off sharp but now feels more dull). The pain does not radiate. Pertinent negatives include no abdominal pain, no back pain, no cough, no fever, no irregular heartbeat, no leg pain, no lower extremity edema, no nausea, no palpitations, no shortness of breath, no syncope, no vomiting and no weakness. He has tried nothing for the symptoms. The treatment provided moderate relief. There are no known risk factors. Past medical history comments: Patient has a history of hypertension only. No history of MI, PE or DVT.  Pertinent negatives for family medical history include: no CAD and no PE.    Past Medical History:  Diagnosis Date  . Hypertension     There are no active problems to display for this patient.   History reviewed. No pertinent surgical history.     Home Medications    Prior to Admission medications   Medication Sig Start Date End Date Taking? Authorizing Provider  ciprofloxacin (CIPRO) 500 MG tablet Take 1 tablet (500 mg total) by mouth every 12 (twelve) hours. 08/03/13   Garlon Hatchet, PA-C  HYDROcodone-acetaminophen (NORCO/VICODIN) 5-325 MG per tablet Take 1 tablet by mouth every 4 (four) hours as needed. 08/03/13   Garlon Hatchet, PA-C  ibuprofen (ADVIL,MOTRIN) 200 MG tablet Take 200 mg by mouth every 6 (six) hours as needed for  moderate pain.    Historical Provider, MD    Family History No family history on file.  Social History Social History  Substance Use Topics  . Smoking status: Never Smoker  . Smokeless tobacco: Not on file  . Alcohol use No     Allergies   Patient has no known allergies.   Review of Systems Review of Systems  Constitutional: Negative for fever.  Respiratory: Negative for cough and shortness of breath.   Cardiovascular: Positive for chest pain. Negative for palpitations and syncope.  Gastrointestinal: Negative for abdominal pain, nausea and vomiting.  Musculoskeletal: Negative for back pain.  Neurological: Negative for weakness.  All other systems reviewed and are negative.    Physical Exam Updated Vital Signs BP 117/75   Pulse (!) 58   Temp 98.4 F (36.9 C) (Oral)   Resp 17   SpO2 99%   Physical Exam  Constitutional: He is oriented to person, place, and time. He appears well-developed and well-nourished. No distress.  HENT:  Head: Normocephalic and atraumatic.  Mouth/Throat: Oropharynx is clear and moist.  Eyes: Conjunctivae and EOM are normal. Pupils are equal, round, and reactive to light.  Neck: Normal range of motion. Neck supple.  Cardiovascular: Normal rate, regular rhythm and intact distal pulses.   No murmur heard. Pulmonary/Chest: Effort normal and breath sounds normal. No respiratory distress. He has no wheezes. He has no rales. He exhibits no tenderness.  Abdominal: Soft. He exhibits no distension.  There is no tenderness. There is no rebound and no guarding.  Musculoskeletal: Normal range of motion. He exhibits no edema or tenderness.  Neurological: He is alert and oriented to person, place, and time.  Skin: Skin is warm and dry. No rash noted. No erythema.  Psychiatric: He has a normal mood and affect. His behavior is normal.  Nursing note and vitals reviewed.    ED Treatments / Results  Labs (all labs ordered are listed, but only abnormal  results are displayed) Labs Reviewed  BASIC METABOLIC PANEL - Abnormal; Notable for the following:       Result Value   Potassium 3.2 (*)    Chloride 98 (*)    Creatinine, Ser 1.25 (*)    All other components within normal limits  CBC  TROPONIN I  D-DIMER, QUANTITATIVE (NOT AT [facility])    EKG  EKG Interpretation  Date/Time:  Saturday July 09 2016 21:29:55 EDT Ventricular Rate:  67 PR Interval:  152 QRS Duration: 90 QT Interval:  388 QTC Calculation: 409 R Axis:   55 Text Interpretation:  Normal sinus rhythm Q and TWI III  Abnormal ECG No comparison Confirmed by Fayrene Fearing  MD, MARK ([id number]) on 07/09/2016 9:33:55 PM Also confirmed by Anitra Lauth  MD, Alphonzo Lemmings [phone])  on 07/09/2016 10:11:44 PM       Radiology Dg Chest 2 View  Result Date: 07/09/2016 CLINICAL DATA:  46 year old male with chest pain EXAM: CHEST  2 VIEW COMPARISON:  None. FINDINGS: The heart size and mediastinal contours are within normal limits. Both lungs are clear. The visualized skeletal structures are unremarkable. IMPRESSION: No active cardiopulmonary disease. Electronically Signed   By: Elgie Collard M.D.   On: 07/09/2016 21:50    Procedures Procedures (including critical care time)  Medications Ordered in ED Medications  ibuprofen (ADVIL,MOTRIN) tablet 400 mg (400 mg Oral Given 07/09/16 2217)     Initial Impression / Assessment and Plan / ED Course  I have reviewed the triage vital signs and the nursing notes.  Pertinent labs & imaging results that were available during my care of the patient were reviewed by me and considered in my medical decision making (see chart for details).     Pt with atypical story for CP started around 8pm tonight at rest.  No related to eating or exertion.  No prior hx of PE or clot in family.  No Tobacco use, travel or surgery.  No hx of CAD or lung disease.  Heart score of 3 and EKG with S1Q3T3.  CXR, CBC, BMP, trop with only mild hypokalemia.  D-dimer ordered due to EKG finding  but low risk well's.  Pt given ibuprofen for pain.  CXR without signs of PTX and no rashes concerning for zoster.  Low suspicion for ACS. D-dimer wnl.   Will d/c home.  Final Clinical Impressions(s) / ED Diagnoses   Final diagnoses:  Chest wall pain    New Prescriptions New Prescriptions   No medications on file     Gwyneth Sprout, MD 07/09/16 2315

## 2016-07-09 NOTE — ED Triage Notes (Signed)
Pt states she had sudden onset of chest pain today; pt states it started off sharp but is now a dull 4/10 on the right side; pt denies SOB or n/v; Pt a&ox 4 on arrival. No other complaints at triage; No obvious distress noted

## 2016-07-29 DIAGNOSIS — H524 Presbyopia: Secondary | ICD-10-CM | POA: Diagnosis not present

## 2016-07-29 DIAGNOSIS — H3552 Pigmentary retinal dystrophy: Secondary | ICD-10-CM | POA: Diagnosis not present

## 2016-08-16 DIAGNOSIS — H524 Presbyopia: Secondary | ICD-10-CM | POA: Diagnosis not present

## 2016-08-31 DIAGNOSIS — H9313 Tinnitus, bilateral: Secondary | ICD-10-CM | POA: Diagnosis not present

## 2016-08-31 DIAGNOSIS — H903 Sensorineural hearing loss, bilateral: Secondary | ICD-10-CM | POA: Diagnosis not present

## 2016-10-18 DIAGNOSIS — H9193 Unspecified hearing loss, bilateral: Secondary | ICD-10-CM | POA: Diagnosis not present

## 2016-10-27 DIAGNOSIS — R7303 Prediabetes: Secondary | ICD-10-CM | POA: Diagnosis not present

## 2016-10-27 DIAGNOSIS — I10 Essential (primary) hypertension: Secondary | ICD-10-CM | POA: Diagnosis not present

## 2016-10-27 DIAGNOSIS — E7800 Pure hypercholesterolemia, unspecified: Secondary | ICD-10-CM | POA: Diagnosis not present

## 2016-10-27 DIAGNOSIS — M79671 Pain in right foot: Secondary | ICD-10-CM | POA: Diagnosis not present

## 2016-11-15 DIAGNOSIS — H9312 Tinnitus, left ear: Secondary | ICD-10-CM | POA: Diagnosis not present

## 2016-11-15 DIAGNOSIS — H903 Sensorineural hearing loss, bilateral: Secondary | ICD-10-CM | POA: Diagnosis not present

## 2017-01-13 DIAGNOSIS — Z0189 Encounter for other specified special examinations: Secondary | ICD-10-CM | POA: Diagnosis not present

## 2017-01-13 DIAGNOSIS — H90A22 Sensorineural hearing loss, unilateral, left ear, with restricted hearing on the contralateral side: Secondary | ICD-10-CM | POA: Diagnosis not present

## 2017-01-13 DIAGNOSIS — I10 Essential (primary) hypertension: Secondary | ICD-10-CM | POA: Diagnosis not present

## 2017-01-25 DIAGNOSIS — Z1389 Encounter for screening for other disorder: Secondary | ICD-10-CM | POA: Diagnosis not present

## 2017-01-26 DIAGNOSIS — J329 Chronic sinusitis, unspecified: Secondary | ICD-10-CM | POA: Diagnosis not present

## 2017-10-25 IMAGING — CR DG CHEST 2V
2 series · 2 of 2 positions shown · non-contrast
Comparison: None.

CLINICAL DATA: 45-year-old male with chest pain

EXAM:
CHEST  2 VIEW

[chest pa]
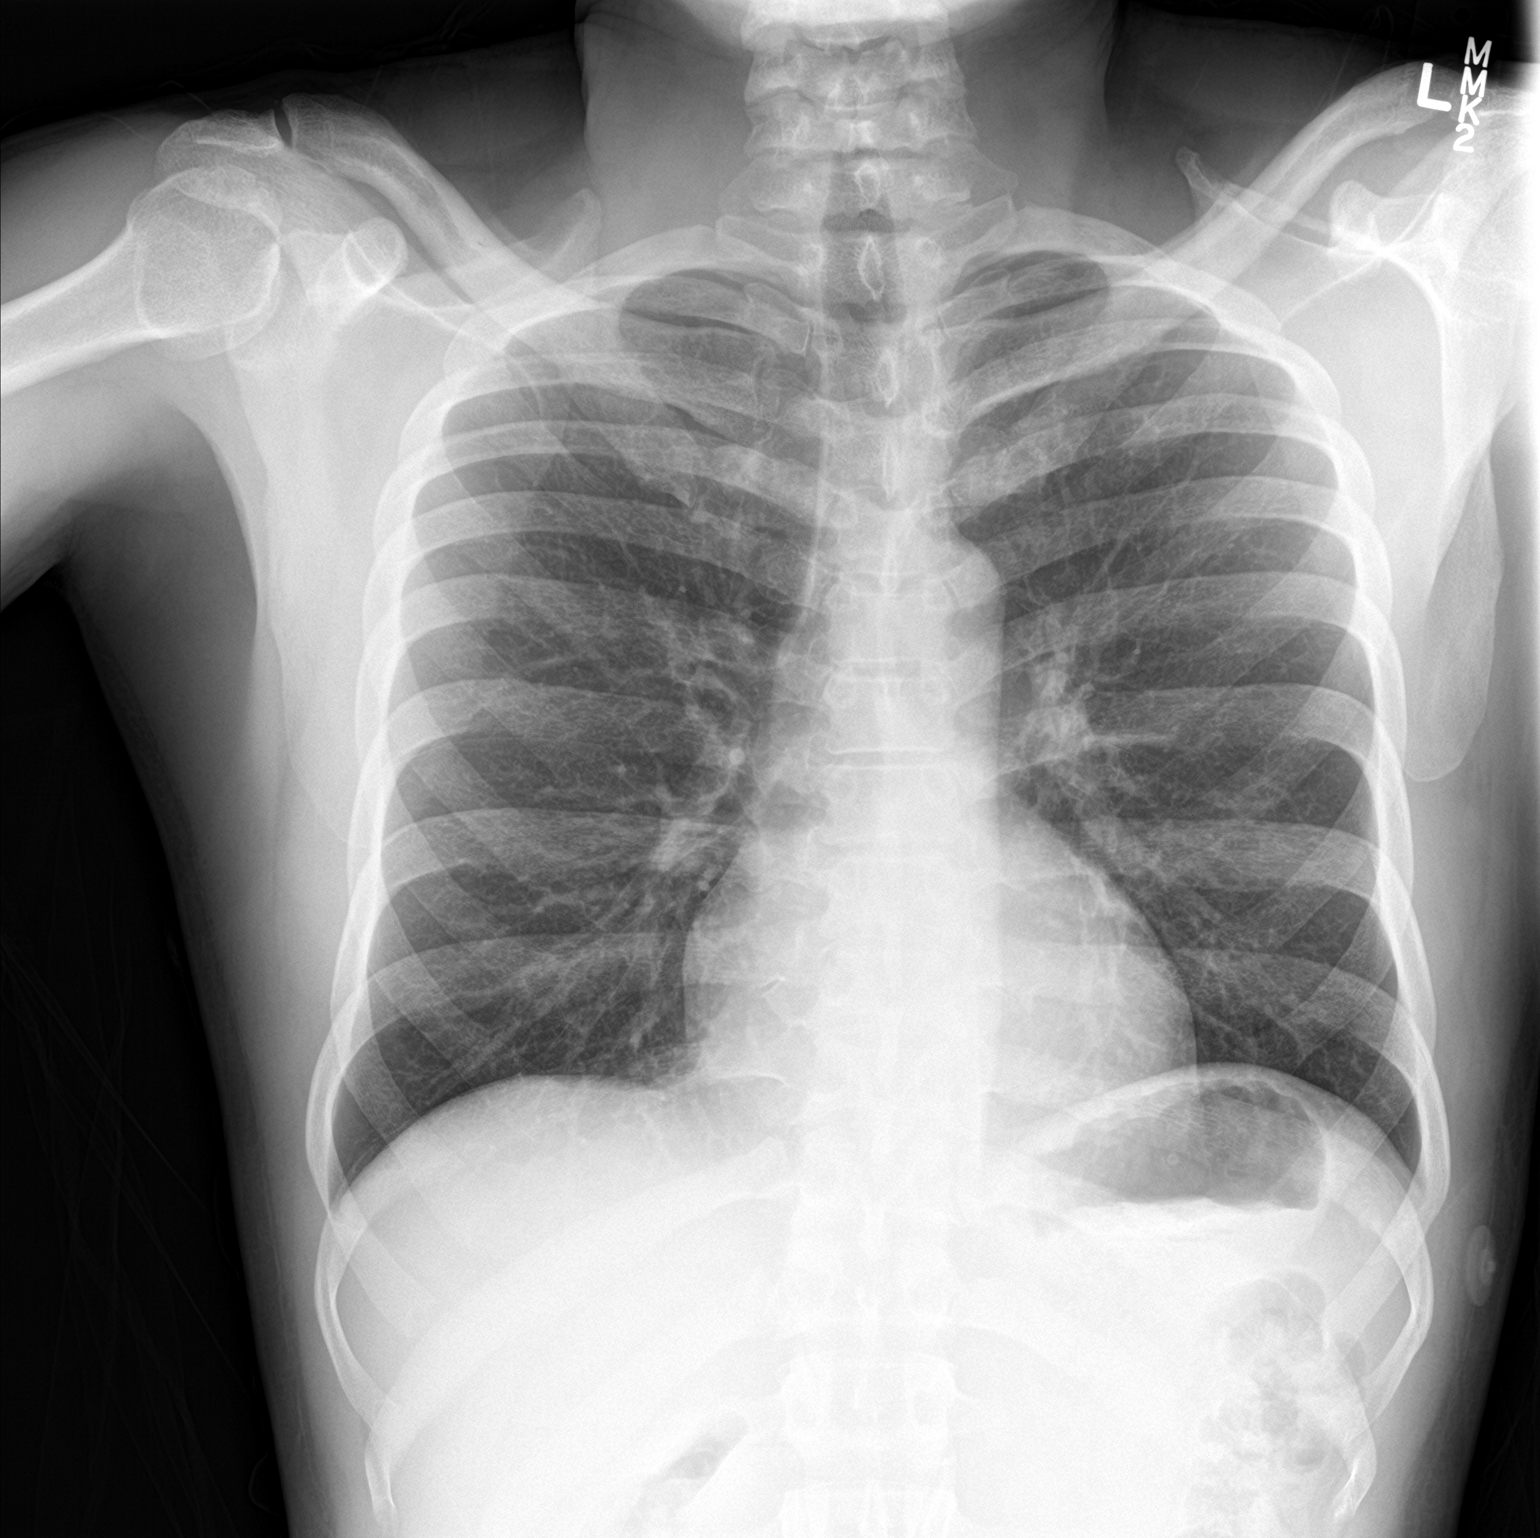

[chest lat]
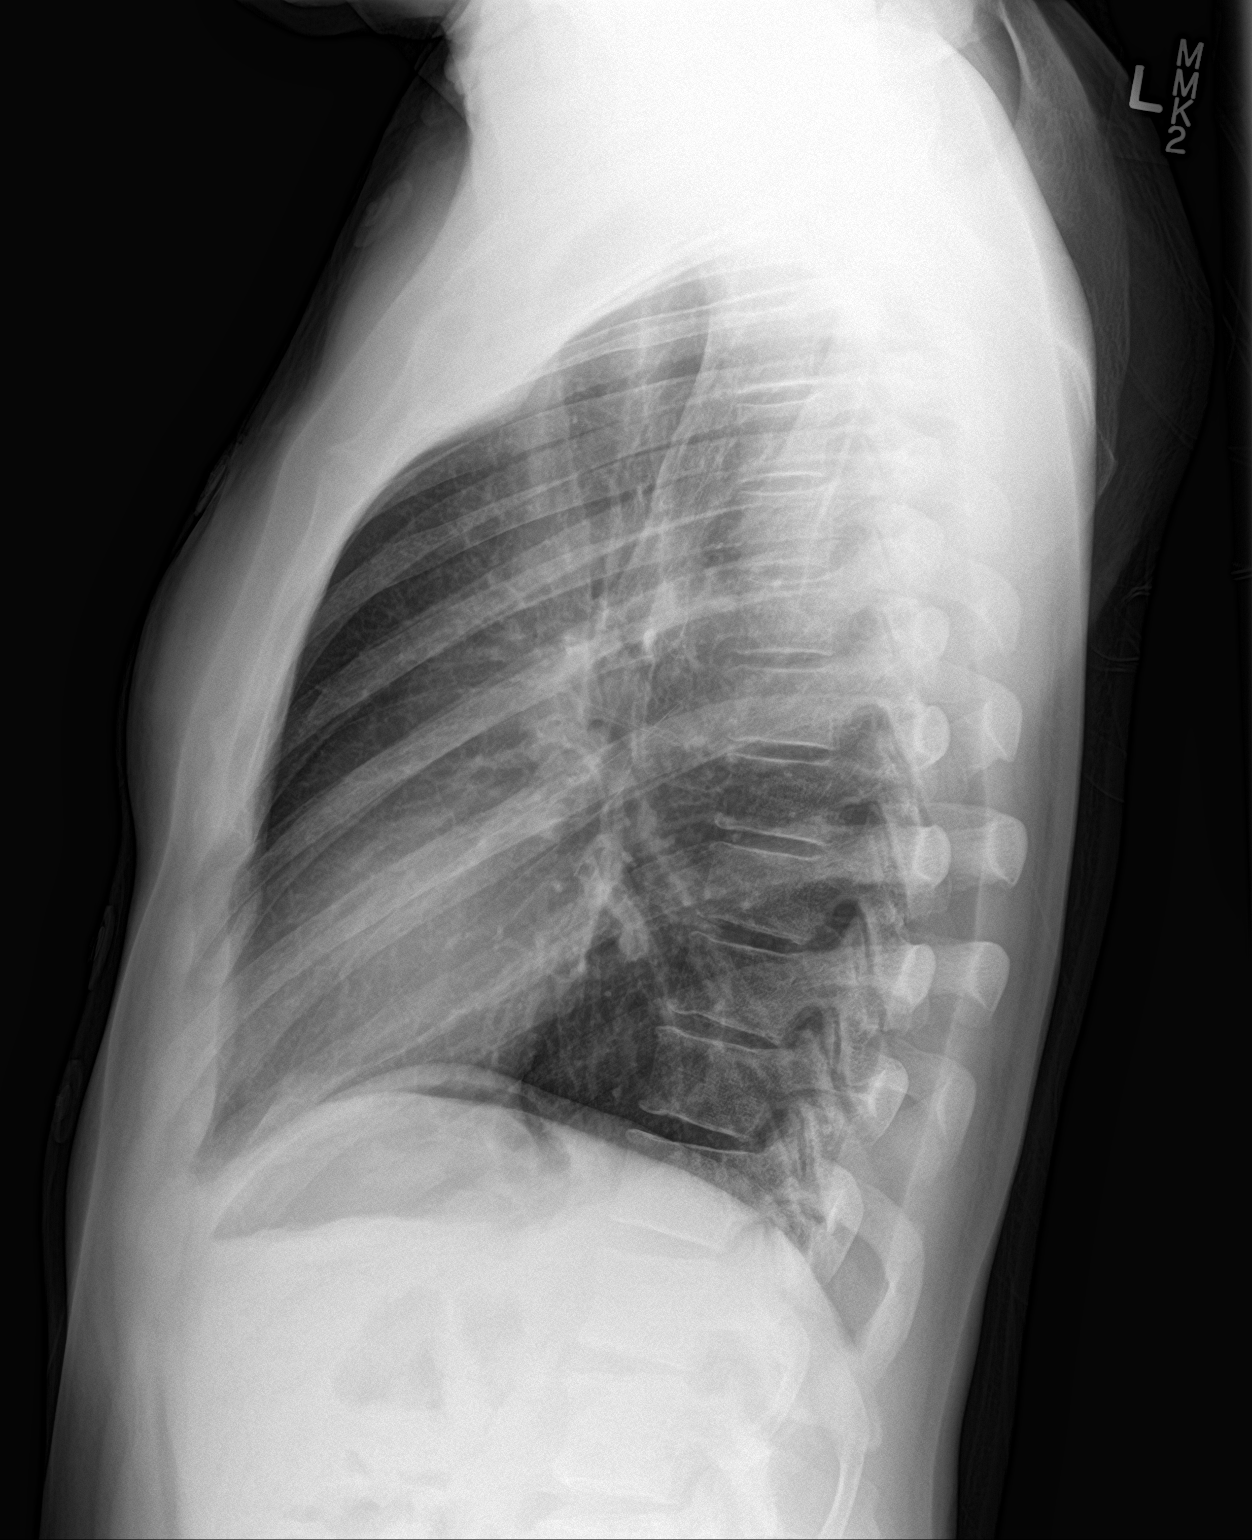

[2 of 2 positions shown; findings below may reference images not displayed]

FINDINGS: The heart size and mediastinal contours are within normal limits.
Both lungs are clear. The visualized skeletal structures are
unremarkable.
IMPRESSION: No active cardiopulmonary disease.

## 2018-01-15 ENCOUNTER — Ambulatory Visit (INDEPENDENT_AMBULATORY_CARE_PROVIDER_SITE_OTHER): Payer: BLUE CROSS/BLUE SHIELD

## 2018-01-15 ENCOUNTER — Ambulatory Visit: Payer: BLUE CROSS/BLUE SHIELD | Admitting: Podiatry

## 2018-01-15 ENCOUNTER — Other Ambulatory Visit: Payer: Self-pay | Admitting: Podiatry

## 2018-01-15 VITALS — BP 163/98 | HR 86

## 2018-01-15 DIAGNOSIS — M778 Other enthesopathies, not elsewhere classified: Secondary | ICD-10-CM

## 2018-01-15 DIAGNOSIS — M722 Plantar fascial fibromatosis: Secondary | ICD-10-CM

## 2018-01-15 DIAGNOSIS — M779 Enthesopathy, unspecified: Principal | ICD-10-CM

## 2018-01-15 MED ORDER — MELOXICAM 15 MG PO TABS: 15.0000 mg | ORAL_TABLET | Freq: Every day | ORAL | 0 refills | Status: AC

## 2018-01-15 MED ORDER — TRIAMCINOLONE ACETONIDE 10 MG/ML IJ SUSP
10.0000 mg | Freq: Once | INTRAMUSCULAR | Status: AC
  Administered 2018-01-15: 10 mg

## 2018-01-15 NOTE — Patient Instructions (Signed)

## 2018-01-15 NOTE — Progress Notes (Signed)
Subjective:   Patient ID: Erik Mclaughlin, male   DOB: 47 y.o.   MRN: [id number]   HPI 47 year old male presents the office today for concerns of bilateral heel pain with the right side worse than the left.  He states is been ongoing for about 3 weeks.  Starting from his right foot and then afterwards the left foot started to hurt.  He states that the pain is worse after he sits for some time and stands back up.  The pain is becoming more consistent.  He denies any numbness or tingling.  No recent injury.  No significant swelling.  The pain does not wake him up at night.  He is tried Motrin which is helped some but when he stops taking the pain comes back.  No other treatment no other concerns.  Review of Systems  All other systems reviewed and are negative.   Past Medical History:  Diagnosis Date  . Hypertension     No past surgical history on file.   Current Outpatient Medications:  .  ciprofloxacin (CIPRO) 500 MG tablet, Take 1 tablet (500 mg total) by mouth every 12 (twelve) hours. (Patient not taking: Reported on 01/15/2018), Disp: 20 tablet, Rfl: 0 .  HYDROcodone-acetaminophen (NORCO/VICODIN) 5-325 MG per tablet, Take 1 tablet by mouth every 4 (four) hours as needed. (Patient not taking: Reported on 01/15/2018), Disp: 10 tablet, Rfl: 0 .  ibuprofen (ADVIL,MOTRIN) 200 MG tablet, Take 200 mg by mouth every 6 (six) hours as needed for moderate pain., Disp: , Rfl:  .  meloxicam (MOBIC) 15 MG tablet, Take 1 tablet (15 mg total) by mouth daily., Disp: 30 tablet, Rfl: 0  No Known Allergies      Objective:  Physical Exam  General: AAO x3, NAD  Dermatological: Skin is warm, dry and supple bilateral. Nails x 10 are well manicured; remaining integument appears unremarkable at this time. There are no open sores, no preulcerative lesions, no rash or signs of infection present.  Vascular: Dorsalis Pedis artery and Posterior Tibial artery pedal pulses are 2/4 bilateral with immedate  capillary fill time. Pedal hair growth present. No varicosities and no lower extremity edema present bilateral. There is no pain with calf compression, swelling, warmth, erythema.   Neruologic: Grossly intact via light touch bilateral. Protective threshold with Semmes Wienstein monofilament intact to all pedal sites bilateral.  Negative Tinel sign.  Musculoskeletal: Tenderness to palpation along the plantar medial tubercle of the calcaneus at the insertion of plantar fascia on the right foot.  There is no pain in her left foot today.  There is no pain along the course of the plantar fascia within the arch of the foot. Plantar fascia appears to be intact. There is no pain with lateral compression of the calcaneus or pain with vibratory sensation. There is no pain along the course or insertion of the achilles tendon. No other areas of tenderness to bilateral lower extremities. Muscular strength 5/5 in all groups tested bilateral.  Gait: Unassisted, Nonantalgic.       Assessment:   47 year old male bilateral heel pain right side worse than left, plantar fascial.    Plan:  -Treatment options discussed including all alternatives, risks, and complications -Etiology of symptoms were discussed -X-rays were obtained and reviewed with the patient.  Posterior compression splint is present.  No inferior spurring.  No evidence of acute fracture. -Steroid injection performed on the right foot.  See procedure note below. -Discussed stretching, icing daily.  Plantar fascial braces were dispensed  x2.  Discussed shoe modifications and orthotics.  Procedure: Injection Tendon/Ligament Discussed alternatives, risks, complications and verbal consent was obtained.  Location: Right plantar fascia at the glabrous junction; medial approach. Skin Prep: Alcohol. Injectate: 0.5cc 0.5% marcaine plain, 0.5 cc 2% lidocaine plain and, 1 cc kenalog 10. Disposition: Patient tolerated procedure well. Injection site dressed  with a band-aid.  Post-injection care was discussed and return precautions discussed.    Vivi Barrack DPM

## 2018-02-05 ENCOUNTER — Ambulatory Visit: Payer: BLUE CROSS/BLUE SHIELD | Admitting: Podiatry

## 2018-04-23 DIAGNOSIS — I10 Essential (primary) hypertension: Secondary | ICD-10-CM | POA: Diagnosis not present

## 2018-04-23 DIAGNOSIS — E7800 Pure hypercholesterolemia, unspecified: Secondary | ICD-10-CM | POA: Diagnosis not present

## 2018-04-23 DIAGNOSIS — E876 Hypokalemia: Secondary | ICD-10-CM | POA: Diagnosis not present

## 2018-04-23 DIAGNOSIS — R7303 Prediabetes: Secondary | ICD-10-CM | POA: Diagnosis not present

## 2018-09-22 DIAGNOSIS — M25511 Pain in right shoulder: Secondary | ICD-10-CM | POA: Diagnosis not present

## 2018-10-10 DIAGNOSIS — M25511 Pain in right shoulder: Secondary | ICD-10-CM | POA: Diagnosis not present

## 2018-10-10 DIAGNOSIS — M7581 Other shoulder lesions, right shoulder: Secondary | ICD-10-CM | POA: Diagnosis not present

## 2018-10-10 DIAGNOSIS — M19011 Primary osteoarthritis, right shoulder: Secondary | ICD-10-CM | POA: Diagnosis not present

## 2018-11-02 DIAGNOSIS — R7303 Prediabetes: Secondary | ICD-10-CM | POA: Diagnosis not present

## 2018-11-02 DIAGNOSIS — I10 Essential (primary) hypertension: Secondary | ICD-10-CM | POA: Diagnosis not present

## 2018-11-02 DIAGNOSIS — E7800 Pure hypercholesterolemia, unspecified: Secondary | ICD-10-CM | POA: Diagnosis not present

## 2018-11-16 DIAGNOSIS — R7303 Prediabetes: Secondary | ICD-10-CM | POA: Diagnosis not present

## 2018-11-16 DIAGNOSIS — E7800 Pure hypercholesterolemia, unspecified: Secondary | ICD-10-CM | POA: Diagnosis not present

## 2018-11-16 DIAGNOSIS — Z23 Encounter for immunization: Secondary | ICD-10-CM | POA: Diagnosis not present

## 2018-12-05 DIAGNOSIS — M25512 Pain in left shoulder: Secondary | ICD-10-CM | POA: Diagnosis not present

## 2019-02-28 DIAGNOSIS — B349 Viral infection, unspecified: Secondary | ICD-10-CM | POA: Diagnosis not present

## 2019-02-28 DIAGNOSIS — Z03818 Encounter for observation for suspected exposure to other biological agents ruled out: Secondary | ICD-10-CM | POA: Diagnosis not present

## 2019-03-23 DIAGNOSIS — M7150 Other bursitis, not elsewhere classified, unspecified site: Secondary | ICD-10-CM | POA: Diagnosis not present

## 2019-04-09 DIAGNOSIS — M542 Cervicalgia: Secondary | ICD-10-CM | POA: Diagnosis not present

## 2019-09-19 DIAGNOSIS — M25422 Effusion, left elbow: Secondary | ICD-10-CM | POA: Diagnosis not present

## 2019-09-19 DIAGNOSIS — M109 Gout, unspecified: Secondary | ICD-10-CM | POA: Diagnosis not present

## 2019-09-19 DIAGNOSIS — M25522 Pain in left elbow: Secondary | ICD-10-CM | POA: Diagnosis not present

## 2019-09-28 DIAGNOSIS — M25422 Effusion, left elbow: Secondary | ICD-10-CM | POA: Diagnosis not present

## 2019-09-28 DIAGNOSIS — M7022 Olecranon bursitis, left elbow: Secondary | ICD-10-CM | POA: Diagnosis not present

## 2019-10-05 DIAGNOSIS — M7989 Other specified soft tissue disorders: Secondary | ICD-10-CM | POA: Diagnosis not present

## 2019-10-05 DIAGNOSIS — L03116 Cellulitis of left lower limb: Secondary | ICD-10-CM | POA: Diagnosis not present

## 2019-10-05 DIAGNOSIS — R238 Other skin changes: Secondary | ICD-10-CM | POA: Diagnosis not present

## 2019-10-05 DIAGNOSIS — R2242 Localized swelling, mass and lump, left lower limb: Secondary | ICD-10-CM | POA: Diagnosis not present

## 2019-10-08 DIAGNOSIS — L02416 Cutaneous abscess of left lower limb: Secondary | ICD-10-CM | POA: Diagnosis not present

## 2019-10-08 DIAGNOSIS — M79605 Pain in left leg: Secondary | ICD-10-CM | POA: Diagnosis not present

## 2019-10-08 DIAGNOSIS — M7989 Other specified soft tissue disorders: Secondary | ICD-10-CM | POA: Diagnosis not present

## 2019-10-08 DIAGNOSIS — R21 Rash and other nonspecific skin eruption: Secondary | ICD-10-CM | POA: Diagnosis not present

## 2019-10-11 DIAGNOSIS — I10 Essential (primary) hypertension: Secondary | ICD-10-CM | POA: Diagnosis not present

## 2019-10-11 DIAGNOSIS — L03116 Cellulitis of left lower limb: Secondary | ICD-10-CM | POA: Diagnosis not present

## 2019-10-11 DIAGNOSIS — R7303 Prediabetes: Secondary | ICD-10-CM | POA: Diagnosis not present

## 2019-10-11 DIAGNOSIS — M25522 Pain in left elbow: Secondary | ICD-10-CM | POA: Diagnosis not present

## 2019-10-11 DIAGNOSIS — E7800 Pure hypercholesterolemia, unspecified: Secondary | ICD-10-CM | POA: Diagnosis not present

## 2019-10-25 DIAGNOSIS — E790 Hyperuricemia without signs of inflammatory arthritis and tophaceous disease: Secondary | ICD-10-CM | POA: Diagnosis not present

## 2019-10-25 DIAGNOSIS — R7989 Other specified abnormal findings of blood chemistry: Secondary | ICD-10-CM | POA: Diagnosis not present

## 2019-10-25 DIAGNOSIS — L03116 Cellulitis of left lower limb: Secondary | ICD-10-CM | POA: Diagnosis not present

## 2019-10-25 DIAGNOSIS — I10 Essential (primary) hypertension: Secondary | ICD-10-CM | POA: Diagnosis not present

## 2020-03-12 DIAGNOSIS — Z20822 Contact with and (suspected) exposure to covid-19: Secondary | ICD-10-CM | POA: Diagnosis not present

## 2020-11-10 DIAGNOSIS — Z13220 Encounter for screening for lipoid disorders: Secondary | ICD-10-CM | POA: Diagnosis not present

## 2020-11-10 DIAGNOSIS — Z713 Dietary counseling and surveillance: Secondary | ICD-10-CM | POA: Diagnosis not present

## 2020-11-10 DIAGNOSIS — Z131 Encounter for screening for diabetes mellitus: Secondary | ICD-10-CM | POA: Diagnosis not present

## 2020-11-10 DIAGNOSIS — I10 Essential (primary) hypertension: Secondary | ICD-10-CM | POA: Diagnosis not present

## 2020-11-10 DIAGNOSIS — Z6831 Body mass index (BMI) 31.0-31.9, adult: Secondary | ICD-10-CM | POA: Diagnosis not present

## 2020-11-10 DIAGNOSIS — Z136 Encounter for screening for cardiovascular disorders: Secondary | ICD-10-CM | POA: Diagnosis not present

## 2020-11-17 DIAGNOSIS — I10 Essential (primary) hypertension: Secondary | ICD-10-CM | POA: Diagnosis not present

## 2020-11-17 DIAGNOSIS — E790 Hyperuricemia without signs of inflammatory arthritis and tophaceous disease: Secondary | ICD-10-CM | POA: Diagnosis not present

## 2020-11-17 DIAGNOSIS — R7303 Prediabetes: Secondary | ICD-10-CM | POA: Diagnosis not present

## 2020-11-17 DIAGNOSIS — E7800 Pure hypercholesterolemia, unspecified: Secondary | ICD-10-CM | POA: Diagnosis not present

## 2021-02-21 DIAGNOSIS — M7989 Other specified soft tissue disorders: Secondary | ICD-10-CM | POA: Diagnosis not present

## 2021-02-21 DIAGNOSIS — M79671 Pain in right foot: Secondary | ICD-10-CM | POA: Diagnosis not present

## 2021-02-21 DIAGNOSIS — R2241 Localized swelling, mass and lump, right lower limb: Secondary | ICD-10-CM | POA: Diagnosis not present

## 2021-11-08 DIAGNOSIS — Z6830 Body mass index (BMI) 30.0-30.9, adult: Secondary | ICD-10-CM | POA: Diagnosis not present

## 2021-11-08 DIAGNOSIS — Z23 Encounter for immunization: Secondary | ICD-10-CM | POA: Diagnosis not present

## 2021-11-08 DIAGNOSIS — Z13220 Encounter for screening for lipoid disorders: Secondary | ICD-10-CM | POA: Diagnosis not present

## 2021-11-08 DIAGNOSIS — Z131 Encounter for screening for diabetes mellitus: Secondary | ICD-10-CM | POA: Diagnosis not present

## 2021-11-08 DIAGNOSIS — Z713 Dietary counseling and surveillance: Secondary | ICD-10-CM | POA: Diagnosis not present

## 2021-11-08 DIAGNOSIS — I10 Essential (primary) hypertension: Secondary | ICD-10-CM | POA: Diagnosis not present

## 2021-11-30 DIAGNOSIS — Z125 Encounter for screening for malignant neoplasm of prostate: Secondary | ICD-10-CM | POA: Diagnosis not present

## 2021-11-30 DIAGNOSIS — E790 Hyperuricemia without signs of inflammatory arthritis and tophaceous disease: Secondary | ICD-10-CM | POA: Diagnosis not present

## 2021-11-30 DIAGNOSIS — E7800 Pure hypercholesterolemia, unspecified: Secondary | ICD-10-CM | POA: Diagnosis not present

## 2021-11-30 DIAGNOSIS — I10 Essential (primary) hypertension: Secondary | ICD-10-CM | POA: Diagnosis not present

## 2021-11-30 DIAGNOSIS — Z23 Encounter for immunization: Secondary | ICD-10-CM | POA: Diagnosis not present

## 2021-11-30 DIAGNOSIS — R7303 Prediabetes: Secondary | ICD-10-CM | POA: Diagnosis not present

## 2021-11-30 DIAGNOSIS — Z0000 Encounter for general adult medical examination without abnormal findings: Secondary | ICD-10-CM | POA: Diagnosis not present

## 2022-01-27 DIAGNOSIS — K635 Polyp of colon: Secondary | ICD-10-CM | POA: Diagnosis not present

## 2022-01-27 DIAGNOSIS — K6389 Other specified diseases of intestine: Secondary | ICD-10-CM | POA: Diagnosis not present

## 2022-01-27 DIAGNOSIS — Z1211 Encounter for screening for malignant neoplasm of colon: Secondary | ICD-10-CM | POA: Diagnosis not present

## 2022-03-29 DIAGNOSIS — M25561 Pain in right knee: Secondary | ICD-10-CM | POA: Diagnosis not present

## 2022-03-29 DIAGNOSIS — M7989 Other specified soft tissue disorders: Secondary | ICD-10-CM | POA: Diagnosis not present

## 2022-03-29 DIAGNOSIS — S8011XA Contusion of right lower leg, initial encounter: Secondary | ICD-10-CM | POA: Diagnosis not present

## 2022-03-29 DIAGNOSIS — M79661 Pain in right lower leg: Secondary | ICD-10-CM | POA: Diagnosis not present

## 2022-03-29 DIAGNOSIS — X58XXXA Exposure to other specified factors, initial encounter: Secondary | ICD-10-CM | POA: Diagnosis not present

## 2022-03-29 DIAGNOSIS — Y999 Unspecified external cause status: Secondary | ICD-10-CM | POA: Diagnosis not present

## 2022-04-01 DIAGNOSIS — L03115 Cellulitis of right lower limb: Secondary | ICD-10-CM | POA: Diagnosis not present

## 2022-04-01 DIAGNOSIS — M25461 Effusion, right knee: Secondary | ICD-10-CM | POA: Diagnosis not present

## 2022-04-01 DIAGNOSIS — M79604 Pain in right leg: Secondary | ICD-10-CM | POA: Diagnosis not present

## 2022-04-01 DIAGNOSIS — M7989 Other specified soft tissue disorders: Secondary | ICD-10-CM | POA: Diagnosis not present

## 2022-04-06 DIAGNOSIS — L02415 Cutaneous abscess of right lower limb: Secondary | ICD-10-CM | POA: Diagnosis not present

## 2022-04-07 ENCOUNTER — Other Ambulatory Visit (HOSPITAL_BASED_OUTPATIENT_CLINIC_OR_DEPARTMENT_OTHER): Payer: BLUE CROSS/BLUE SHIELD

## 2022-04-07 ENCOUNTER — Other Ambulatory Visit (HOSPITAL_BASED_OUTPATIENT_CLINIC_OR_DEPARTMENT_OTHER): Payer: Self-pay | Admitting: Family Medicine

## 2022-04-07 DIAGNOSIS — L02415 Cutaneous abscess of right lower limb: Secondary | ICD-10-CM

## 2022-04-08 ENCOUNTER — Ambulatory Visit (HOSPITAL_BASED_OUTPATIENT_CLINIC_OR_DEPARTMENT_OTHER)
Admission: RE | Admit: 2022-04-08 | Discharge: 2022-04-08 | Disposition: A | Discharge status: Home / Self Care | Payer: BC Managed Care – PPO | Source: Ambulatory Visit | Attending: Family Medicine | Admitting: Family Medicine

## 2022-04-08 DIAGNOSIS — L02415 Cutaneous abscess of right lower limb: Secondary | ICD-10-CM

## 2022-04-08 DIAGNOSIS — R600 Localized edema: Secondary | ICD-10-CM | POA: Diagnosis not present

## 2022-09-20 DIAGNOSIS — Z713 Dietary counseling and surveillance: Secondary | ICD-10-CM | POA: Diagnosis not present

## 2022-09-20 DIAGNOSIS — Z136 Encounter for screening for cardiovascular disorders: Secondary | ICD-10-CM | POA: Diagnosis not present

## 2022-09-20 DIAGNOSIS — Z13220 Encounter for screening for lipoid disorders: Secondary | ICD-10-CM | POA: Diagnosis not present

## 2022-09-20 DIAGNOSIS — Z131 Encounter for screening for diabetes mellitus: Secondary | ICD-10-CM | POA: Diagnosis not present

## 2022-09-20 DIAGNOSIS — Z6833 Body mass index (BMI) 33.0-33.9, adult: Secondary | ICD-10-CM | POA: Diagnosis not present

## 2022-12-13 DIAGNOSIS — E790 Hyperuricemia without signs of inflammatory arthritis and tophaceous disease: Secondary | ICD-10-CM | POA: Diagnosis not present

## 2022-12-13 DIAGNOSIS — Z23 Encounter for immunization: Secondary | ICD-10-CM | POA: Diagnosis not present

## 2022-12-13 DIAGNOSIS — I10 Essential (primary) hypertension: Secondary | ICD-10-CM | POA: Diagnosis not present

## 2022-12-13 DIAGNOSIS — R7303 Prediabetes: Secondary | ICD-10-CM | POA: Diagnosis not present

## 2022-12-13 DIAGNOSIS — E7800 Pure hypercholesterolemia, unspecified: Secondary | ICD-10-CM | POA: Diagnosis not present

## 2022-12-13 DIAGNOSIS — R197 Diarrhea, unspecified: Secondary | ICD-10-CM | POA: Diagnosis not present

## 2022-12-13 DIAGNOSIS — Z0000 Encounter for general adult medical examination without abnormal findings: Secondary | ICD-10-CM | POA: Diagnosis not present

## 2022-12-13 DIAGNOSIS — Z125 Encounter for screening for malignant neoplasm of prostate: Secondary | ICD-10-CM | POA: Diagnosis not present

## 2023-02-15 DIAGNOSIS — M25521 Pain in right elbow: Secondary | ICD-10-CM | POA: Diagnosis not present

## 2023-02-15 DIAGNOSIS — S56919A Strain of unspecified muscles, fascia and tendons at forearm level, unspecified arm, initial encounter: Secondary | ICD-10-CM | POA: Diagnosis not present

## 2023-02-17 DIAGNOSIS — M19022 Primary osteoarthritis, left elbow: Secondary | ICD-10-CM | POA: Diagnosis not present

## 2023-02-17 DIAGNOSIS — M25521 Pain in right elbow: Secondary | ICD-10-CM | POA: Diagnosis not present

## 2023-02-17 DIAGNOSIS — M79601 Pain in right arm: Secondary | ICD-10-CM | POA: Diagnosis not present

## 2023-02-17 DIAGNOSIS — M19021 Primary osteoarthritis, right elbow: Secondary | ICD-10-CM | POA: Diagnosis not present

## 2023-02-17 DIAGNOSIS — M779 Enthesopathy, unspecified: Secondary | ICD-10-CM | POA: Diagnosis not present

## 2023-02-17 DIAGNOSIS — M778 Other enthesopathies, not elsewhere classified: Secondary | ICD-10-CM | POA: Diagnosis not present

## 2023-02-21 DIAGNOSIS — M19021 Primary osteoarthritis, right elbow: Secondary | ICD-10-CM | POA: Diagnosis not present

## 2023-02-21 DIAGNOSIS — M7021 Olecranon bursitis, right elbow: Secondary | ICD-10-CM | POA: Diagnosis not present

## 2023-08-17 DIAGNOSIS — M7989 Other specified soft tissue disorders: Secondary | ICD-10-CM | POA: Diagnosis not present

## 2023-08-17 DIAGNOSIS — M722 Plantar fascial fibromatosis: Secondary | ICD-10-CM | POA: Diagnosis not present

## 2023-08-17 DIAGNOSIS — M25572 Pain in left ankle and joints of left foot: Secondary | ICD-10-CM | POA: Diagnosis not present

## 2023-09-06 DIAGNOSIS — M79671 Pain in right foot: Secondary | ICD-10-CM | POA: Diagnosis not present

## 2023-09-06 DIAGNOSIS — M722 Plantar fascial fibromatosis: Secondary | ICD-10-CM | POA: Diagnosis not present

## 2023-09-06 DIAGNOSIS — M76821 Posterior tibial tendinitis, right leg: Secondary | ICD-10-CM | POA: Diagnosis not present

## 2023-09-06 DIAGNOSIS — Q6689 Other  specified congenital deformities of feet: Secondary | ICD-10-CM | POA: Diagnosis not present

## 2023-09-06 DIAGNOSIS — M76822 Posterior tibial tendinitis, left leg: Secondary | ICD-10-CM | POA: Diagnosis not present

## 2023-10-27 DIAGNOSIS — M2141 Flat foot [pes planus] (acquired), right foot: Secondary | ICD-10-CM | POA: Diagnosis not present

## 2023-10-27 DIAGNOSIS — M76821 Posterior tibial tendinitis, right leg: Secondary | ICD-10-CM | POA: Diagnosis not present

## 2023-10-27 DIAGNOSIS — M722 Plantar fascial fibromatosis: Secondary | ICD-10-CM | POA: Diagnosis not present

## 2023-10-27 DIAGNOSIS — M62461 Contracture of muscle, right lower leg: Secondary | ICD-10-CM | POA: Diagnosis not present

## 2023-12-21 DIAGNOSIS — N644 Mastodynia: Secondary | ICD-10-CM | POA: Diagnosis not present

## 2023-12-21 DIAGNOSIS — N6320 Unspecified lump in the left breast, unspecified quadrant: Secondary | ICD-10-CM | POA: Diagnosis not present

## 2024-01-02 DIAGNOSIS — Z23 Encounter for immunization: Secondary | ICD-10-CM | POA: Diagnosis not present

## 2024-01-02 DIAGNOSIS — E790 Hyperuricemia without signs of inflammatory arthritis and tophaceous disease: Secondary | ICD-10-CM | POA: Diagnosis not present

## 2024-01-02 DIAGNOSIS — R7303 Prediabetes: Secondary | ICD-10-CM | POA: Diagnosis not present

## 2024-01-02 DIAGNOSIS — E7800 Pure hypercholesterolemia, unspecified: Secondary | ICD-10-CM | POA: Diagnosis not present

## 2024-01-02 DIAGNOSIS — Z0000 Encounter for general adult medical examination without abnormal findings: Secondary | ICD-10-CM | POA: Diagnosis not present

## 2024-01-02 DIAGNOSIS — I10 Essential (primary) hypertension: Secondary | ICD-10-CM | POA: Diagnosis not present

## 2024-01-02 DIAGNOSIS — Z125 Encounter for screening for malignant neoplasm of prostate: Secondary | ICD-10-CM | POA: Diagnosis not present

## 2024-01-12 DIAGNOSIS — N644 Mastodynia: Secondary | ICD-10-CM | POA: Diagnosis not present

## 2024-01-12 DIAGNOSIS — R92323 Mammographic fibroglandular density, bilateral breasts: Secondary | ICD-10-CM | POA: Diagnosis not present

## 2024-01-15 DIAGNOSIS — N62 Hypertrophy of breast: Secondary | ICD-10-CM | POA: Diagnosis not present

## 2024-02-12 DIAGNOSIS — Z79899 Other long term (current) drug therapy: Secondary | ICD-10-CM | POA: Diagnosis not present

## 2024-02-12 DIAGNOSIS — N62 Hypertrophy of breast: Secondary | ICD-10-CM | POA: Diagnosis not present

## 2024-02-12 DIAGNOSIS — M19021 Primary osteoarthritis, right elbow: Secondary | ICD-10-CM | POA: Diagnosis not present

## 2024-02-12 DIAGNOSIS — N644 Mastodynia: Secondary | ICD-10-CM | POA: Diagnosis not present

## 2024-02-12 DIAGNOSIS — Z791 Long term (current) use of non-steroidal anti-inflammatories (NSAID): Secondary | ICD-10-CM | POA: Diagnosis not present

## 2024-02-21 DIAGNOSIS — Z09 Encounter for follow-up examination after completed treatment for conditions other than malignant neoplasm: Secondary | ICD-10-CM | POA: Diagnosis not present
